# Patient Record
Sex: Male | Born: 1980 | Race: White | Hispanic: No | Marital: Single | Smoking: Never smoker
Health system: Southern US, Community
[De-identification: ages and names within clinical notes are randomized; demographics above are authoritative.]

---

## 1999-03-02 ENCOUNTER — Encounter: Payer: Self-pay | Admitting: General Surgery

## 1999-03-02 ENCOUNTER — Encounter: Payer: Self-pay | Admitting: Emergency Medicine

## 1999-03-02 ENCOUNTER — Inpatient Hospital Stay (HOSPITAL_COMMUNITY): Admission: EM | Admit: 1999-03-02 | Discharge: 1999-03-12 | Payer: Self-pay | Admitting: Emergency Medicine

## 1999-03-03 ENCOUNTER — Encounter: Payer: Self-pay | Admitting: General Surgery

## 1999-03-03 ENCOUNTER — Encounter: Payer: Self-pay | Admitting: Cardiothoracic Surgery

## 1999-03-04 ENCOUNTER — Encounter: Payer: Self-pay | Admitting: General Surgery

## 1999-03-05 ENCOUNTER — Encounter: Payer: Self-pay | Admitting: General Surgery

## 1999-03-06 ENCOUNTER — Encounter: Payer: Self-pay | Admitting: General Surgery

## 1999-03-07 ENCOUNTER — Encounter: Payer: Self-pay | Admitting: General Surgery

## 1999-03-08 ENCOUNTER — Encounter: Payer: Self-pay | Admitting: General Surgery

## 1999-03-09 ENCOUNTER — Encounter: Payer: Self-pay | Admitting: General Surgery

## 1999-03-10 ENCOUNTER — Encounter: Payer: Self-pay | Admitting: General Surgery

## 1999-03-11 ENCOUNTER — Encounter: Payer: Self-pay | Admitting: Orthopedic Surgery

## 1999-03-12 ENCOUNTER — Encounter: Payer: Self-pay | Admitting: General Surgery

## 1999-03-19 ENCOUNTER — Encounter: Payer: Self-pay | Admitting: General Surgery

## 1999-03-19 ENCOUNTER — Ambulatory Visit (HOSPITAL_COMMUNITY): Admission: RE | Admit: 1999-03-19 | Discharge: 1999-03-19 | Payer: Self-pay | Admitting: General Surgery

## 1999-04-08 ENCOUNTER — Ambulatory Visit (HOSPITAL_COMMUNITY): Admission: RE | Admit: 1999-04-08 | Discharge: 1999-04-08 | Payer: Self-pay | Admitting: General Surgery

## 1999-04-08 ENCOUNTER — Encounter: Payer: Self-pay | Admitting: General Surgery

## 1999-04-21 ENCOUNTER — Ambulatory Visit (HOSPITAL_COMMUNITY): Admission: RE | Admit: 1999-04-21 | Discharge: 1999-04-21 | Payer: Self-pay

## 2000-02-04 ENCOUNTER — Emergency Department (HOSPITAL_COMMUNITY): Admission: EM | Admit: 2000-02-04 | Discharge: 2000-02-04 | Payer: Self-pay | Admitting: Emergency Medicine

## 2000-02-04 ENCOUNTER — Encounter: Payer: Self-pay | Admitting: Emergency Medicine

## 2001-02-23 ENCOUNTER — Encounter: Payer: Self-pay | Admitting: Emergency Medicine

## 2001-02-23 ENCOUNTER — Emergency Department (HOSPITAL_COMMUNITY): Admission: AC | Admit: 2001-02-23 | Discharge: 2001-02-23 | Payer: Self-pay

## 2003-03-20 ENCOUNTER — Emergency Department (HOSPITAL_COMMUNITY): Admission: AC | Admit: 2003-03-20 | Discharge: 2003-03-21 | Payer: Self-pay

## 2003-04-18 ENCOUNTER — Encounter: Payer: Self-pay | Admitting: Internal Medicine

## 2003-04-18 ENCOUNTER — Encounter: Admission: RE | Admit: 2003-04-18 | Discharge: 2003-04-18 | Payer: Self-pay | Admitting: Internal Medicine

## 2005-07-29 ENCOUNTER — Encounter: Admission: RE | Admit: 2005-07-29 | Discharge: 2005-07-29 | Payer: Self-pay | Admitting: Internal Medicine

## 2013-08-01 ENCOUNTER — Ambulatory Visit: Payer: Self-pay

## 2013-08-01 ENCOUNTER — Other Ambulatory Visit: Payer: Self-pay | Admitting: Occupational Medicine

## 2013-08-01 DIAGNOSIS — M79671 Pain in right foot: Secondary | ICD-10-CM

## 2013-08-01 DIAGNOSIS — M25571 Pain in right ankle and joints of right foot: Secondary | ICD-10-CM

## 2017-12-11 DIAGNOSIS — S61211A Laceration without foreign body of left index finger without damage to nail, initial encounter: Secondary | ICD-10-CM | POA: Diagnosis not present

## 2017-12-16 DIAGNOSIS — Z0000 Encounter for general adult medical examination without abnormal findings: Secondary | ICD-10-CM | POA: Diagnosis not present

## 2017-12-23 DIAGNOSIS — Z0000 Encounter for general adult medical examination without abnormal findings: Secondary | ICD-10-CM | POA: Diagnosis not present

## 2017-12-23 DIAGNOSIS — H9319 Tinnitus, unspecified ear: Secondary | ICD-10-CM | POA: Diagnosis not present

## 2019-03-06 ENCOUNTER — Other Ambulatory Visit: Payer: Self-pay | Admitting: Physician Assistant

## 2019-03-06 DIAGNOSIS — S60221A Contusion of right hand, initial encounter: Secondary | ICD-10-CM

## 2019-07-14 ENCOUNTER — Other Ambulatory Visit: Payer: Self-pay

## 2019-07-14 DIAGNOSIS — Z20822 Contact with and (suspected) exposure to covid-19: Secondary | ICD-10-CM

## 2019-07-18 LAB — NOVEL CORONAVIRUS, NAA: SARS-CoV-2, NAA: NOT DETECTED

## 2019-08-30 ENCOUNTER — Ambulatory Visit (INDEPENDENT_AMBULATORY_CARE_PROVIDER_SITE_OTHER): Payer: Commercial Managed Care - PPO | Admitting: Medical

## 2019-08-30 VITALS — BP 122/74 | HR 91 | Temp 98.8°F | Resp 12 | Ht 72.0 in | Wt 313.6 lb

## 2019-08-30 DIAGNOSIS — R05 Cough: Secondary | ICD-10-CM

## 2019-08-30 DIAGNOSIS — U071 COVID-19: Secondary | ICD-10-CM | POA: Diagnosis not present

## 2019-08-30 DIAGNOSIS — E669 Obesity, unspecified: Secondary | ICD-10-CM | POA: Insufficient documentation

## 2019-08-30 DIAGNOSIS — R0602 Shortness of breath: Secondary | ICD-10-CM | POA: Diagnosis not present

## 2019-08-30 DIAGNOSIS — R0683 Snoring: Secondary | ICD-10-CM

## 2019-08-30 DIAGNOSIS — R059 Cough, unspecified: Secondary | ICD-10-CM

## 2019-08-30 DIAGNOSIS — R0681 Apnea, not elsewhere classified: Secondary | ICD-10-CM | POA: Insufficient documentation

## 2019-08-30 MED ORDER — ALBUTEROL SULFATE HFA 108 (90 BASE) MCG/ACT IN AERS: 2.0000 | INHALATION_SPRAY | Freq: Four times a day (QID) | RESPIRATORY_TRACT | 0 refills | Status: AC | PRN

## 2019-08-30 MED ORDER — BENZONATATE 200 MG PO CAPS: 200.0000 mg | ORAL_CAPSULE | Freq: Three times a day (TID) | ORAL | 0 refills | Status: DC | PRN

## 2019-08-30 MED ORDER — EMERGEN-C IMMUNE PLUS PO PACK: 1.0000 | PACK | Freq: Two times a day (BID) | ORAL | 0 refills | Status: AC

## 2019-08-30 NOTE — Progress Notes (Signed)
[facility]   Subjective:  Victor Greene is a 39 y.o. male who presents for respiratory illness.    PCP:  Pearson Grippe, MD  Victor Greene reports no significant prior health history.  He notes he normally goes in for a physical once a year with Dr. Selena Batten.  He was in his usual state of health until started feeling bad 08/24/2018.  He got tested for Covid the next day and was positive the following day.  His wife became ill a few days before him and she also was positive.  He notes that she is very sick and is being seen here in the respiratory clinic tonight as well.   She has brain cancer asthma and is not doing well with her symptoms.  He asked to be seen tonight when they checked him.  He was not referred by his PCP  Since his symptoms began he has continued to have body aches, sweats, burning in the throat, coughing, nausea, some loose stool the last 2 days, fever up to 102 at times, shortness of breath, wheezing.  They purchased a pulse oximeter.  His pulse oxygen has been as low as 92% he has been using Tylenol for symptoms.  He denies vomiting.  He feels that he is drinking plenty of fluids.  Unrelated, he notes that he does snore, his wife has mentioned that he probably has sleep apnea.  He agrees he probably has sleep apnea.  No prior testing.   Patient is not a smoker.  No other aggravating or relieving factors.  No other c/o.  History reviewed. No pertinent past medical history.  History reviewed. No pertinent past medical history.  ROS as in subjective   Objective: BP 122/74 (BP Location: Left Arm, Patient Position: Sitting, Cuff Size: Large)   Pulse 91   Temp 98.8 F (37.1 C)   Resp 12   Ht 6' (1.829 m)   Wt (!) 313 lb 9.6 oz (142.2 kg)   SpO2 97%   BMI 42.53 kg/m    Wt Readings from Last 3 Encounters:  08/30/19 (!) 313 lb 9.6 oz (142.2 kg)   Wt Readings from Last 3 Encounters:  08/30/19 (!) 313 lb 9.6 oz (142.2 kg)   BP Readings from Last 3  Encounters:  08/30/19 122/74     General appearance: Alert, WD/WN, no distress, mildly ill appearing, coughing periodically, obese white male                             Skin: warm, no rash                           Head: no sinus tenderness                            Eyes: conjunctiva injected, corneas clear, PERRLA                            Ears: Flat TMs, external ear canals normal                          Nose: septum midline, turbinates swollen, with erythema and no discharge             Mouth/throat: MMM, tongue normal, mild pharyngeal erythema  Neck: supple, no adenopathy, no thyromegaly, non tender                          Heart: RRR, normal S1, S2, no murmurs                         Lungs: Faint crackles throughout , no wheezes, but no labored breathing Abdomen: Nontender no organomegaly, no mass Extremities nontender no asymmetry negative Homans, no edema       Assessment  Encounter Diagnoses  Name Primary?  . Cough Yes  . COVID-19   . SOB (shortness of breath)   . Snoring   . Witnessed apneic spells   . Obesity, unspecified classification, unspecified obesity type, unspecified whether serious comorbidity present       Plan: We discussed symptoms, concerns.   I reviewed his chart history.    We discussed that his current vital signs are stable, his lungs do have some coarse sounds.  We will send him for x-ray tomorrow morning at [facility].  We discussed using albuterol to help with symptoms, Tessalon Perle cough drops, and discussed the other general recommendations below.  We discussed symptoms that would prompt urgent recheck at the hospital.  We discussed quarantine measures.  Advised if pulse ox is staying 90% or below to get reevaluated at the hospital.  Advised that once he is beyond quarantine and improving back to baseline he should have a follow-up with his primary care for routine physical and evaluation for sleep  apnea.   General recommendations: I recommend you rest, hydrate well with water and clear fluids throughout the day.   Drink enough water and clear fluids so that your urine is clear.   Begin Albuterol resource inhaler 2 puffs every 4- 6 hours for cough, wheezing, or shortness of breath You can use Tylenol for pain or fever every 4-6 hours. You can use over the counter Tessalon Perles prescription for cough. You can use over the counter Emetrol for nausea.    Consider using over the counter Emergen-C Immune + over the counter for the next week.   If you need any medications from the pharmacy, have a friend or family member pick them up from the pharmacy for you.  Have them drop off the medications at your home to keep you from having to go into the pharmacy and potentially exposure others.   If this is not possible, see if your pharmacy does home delivery.  Or worse case scenario, go through the drive through at your pharmacy, wear your mask, use hand sanitizer before touching anything in the drive through transaction, and limit interaction with the store personally, particularly staying > 6 feet apart.  Over the next few days if you are having worse trouble breathing, if you are very weak, have persistent fever 101 or higher consistently despite Tylenol, uncontrollable nausea and vomiting, or feel very dehydrated, then call or go to the emergency department.    If you have other questions or have other symptoms or questions you are concerned about then please make a virtual visit with your primary care provider.  Covid symptoms such as fatigue and cough can linger over 2 weeks, even after the initial fever, aches, chills, and other initial symptoms.     Self Quarantine: The CDC, Centers for Disease Control has recommended a self quarantine of 10 -14 days from the start of your illness until  you are symptom-free including at least 24 hours of no symptoms including no fever, no shortness of  breath, and no body aches and chills, by day 10 before returning to work or general contact with the public.  What does self quarantine mean: avoiding contact with people as much as possible.   Particularly in your house, isolate your self from others in a separate room, wear a mask when possible in the room, particularly if coughing a lot.   Have others bring food, water, medications, etc., to your door, but avoid direct contact with your household contacts during this time to avoid spreading the infection to them.   If you have a separate bathroom and living quarters during the next 2 weeks away from others, that would be preferable.    If you can't completely isolate, then wear a mask, wash hands frequently with soap and water for at least 15 seconds, minimize close contact with others, and have a friend or family member check regularly from a distance to make sure you are not getting seriously worse.     You should not be going out in public, should not be going to stores, to work or other public places until all your symptoms have resolved and at least 10 days + 24 hours of no symptoms at all have transpired.   Ideally you should avoid contact with others for a full 10 days if possible.  One of the goals is to limit spread to high risk people; people that are older and elderly, people with multiple health issues like diabetes, heart disease, lung disease, and anybody that has weakened immune systems such as people with cancer or on immunosuppressive therapy.   Please make a virtual follow up visit within 3-4  days with your primary care provider.      Laurance was seen today for covid positive.  Diagnoses and all orders for this visit:  Cough -     DG Chest 2 View; Future -     Pulse oximetry (single); Future  COVID-19 -     DG Chest 2 View; Future -     Pulse oximetry (single); Future  SOB (shortness of breath) -     DG Chest 2 View; Future -     Pulse oximetry (single);  Future  Snoring  Witnessed apneic spells  Obesity, unspecified classification, unspecified obesity type, unspecified whether serious comorbidity present  Other orders -     benzonatate (TESSALON) 200 MG capsule; Take 1 capsule (200 mg total) by mouth 3 (three) times daily as needed for cough. -     albuterol (VENTOLIN HFA) 108 (90 Base) MCG/ACT inhaler; Inhale 2 puffs into the lungs every 6 (six) hours as needed for wheezing or shortness of breath. -     Multiple Vitamins-Minerals (EMERGEN-C IMMUNE PLUS) PACK; Take 1 tablet by mouth 2 (two) times daily.

## 2019-08-30 NOTE — Addendum Note (Signed)
Addended by: Jac Canavan on: 08/30/2019 09:43 PM   Modules accepted: Orders

## 2019-08-30 NOTE — Patient Instructions (Signed)
Go for xray of chest at [facility] tomorrow after 8am.    Covid symptoms can include fever, tiredness, body aches, cough, sore throat, diarrhea, headache, loss of taste or smell, shortness of breath, rash, and discoloration of fingers or toes.    Risk factors that may put someone at risk for worse outcome with covid infection includes older than 40 years old, underlying health conditions like COPD, heart failure, asthma, diabetes, hypertension, kidney disease, obesity, and history of heart disease or stroke.  Currently your symptoms seem mild.     General recommendations: I recommend you rest, hydrate well with water and clear fluids throughout the day.   Drink enough water and clear fluids so that your urine is clear.   Begin Albuterol resource inhaler 2 puffs every 4- 6 hours for cough, wheezing, or shortness of breath You can use Tylenol for pain or fever every 4-6 hours. You can use over the counter Tessalon Perles prescription for cough. You can use over the counter Emetrol for nausea.    Consider using over the counter Emergen-C Immune + over the counter for the next week.  If you need any medications from the pharmacy, have a friend or family member pick them up from the pharmacy for you.  Have them drop off the medications at your home to keep you from having to go into the pharmacy and potentially exposure others.   If this is not possible, see if your pharmacy does home delivery.  Or worse case scenario, go through the drive through at your pharmacy, wear your mask, use hand sanitizer before touching anything in the drive through transaction, and limit interaction with the store personally, particularly staying > 6 feet apart.  Over the next few days if you are having worse trouble breathing, if you are very weak, have persistent fever 101 or higher consistently despite Tylenol, uncontrollable nausea and vomiting, or feel very dehydrated, then call or go to the emergency  department.    If you have other questions or have other symptoms or questions you are concerned about then please make a virtual visit with your primary care provider.  Covid symptoms such as fatigue and cough can linger over 2 weeks, even after the initial fever, aches, chills, and other initial symptoms.     Self Quarantine: The CDC, Centers for Disease Control has recommended a self quarantine of 10 -14 days from the start of your illness until you are symptom-free including at least 24 hours of no symptoms including no fever, no shortness of breath, and no body aches and chills, by day 10 before returning to work or general contact with the public.  What does self quarantine mean: avoiding contact with people as much as possible.   Particularly in your house, isolate your self from others in a separate room, wear a mask when possible in the room, particularly if coughing a lot.   Have others bring food, water, medications, etc., to your door, but avoid direct contact with your household contacts during this time to avoid spreading the infection to them.   If you have a separate bathroom and living quarters during the next 2 weeks away from others, that would be preferable.    If you can't completely isolate, then wear a mask, wash hands frequently with soap and water for at least 15 seconds, minimize close contact with others, and have a friend or family member check regularly from a distance to make sure you are not getting  seriously worse.     You should not be going out in public, should not be going to stores, to work or other public places until all your symptoms have resolved and at least 10 days + 24 hours of no symptoms at all have transpired.   Ideally you should avoid contact with others for a full 10 days if possible.  One of the goals is to limit spread to high risk people; people that are older and elderly, people with multiple health issues like diabetes, heart disease, lung disease,  and anybody that has weakened immune systems such as people with cancer or on immunosuppressive therapy.   Please make a virtual follow up visit within 3-4  days with your primary care provider.

## 2019-09-01 ENCOUNTER — Other Ambulatory Visit: Payer: Self-pay

## 2019-09-01 ENCOUNTER — Ambulatory Visit (HOSPITAL_COMMUNITY)
Admission: RE | Admit: 2019-09-01 | Discharge: 2019-09-01 | Disposition: A | Discharge status: Home / Self Care | Payer: Commercial Managed Care - PPO | Source: Ambulatory Visit | Attending: Medical | Admitting: Medical

## 2019-09-01 ENCOUNTER — Other Ambulatory Visit: Payer: Self-pay | Admitting: Medical

## 2019-09-01 DIAGNOSIS — R05 Cough: Secondary | ICD-10-CM | POA: Diagnosis present

## 2019-09-01 DIAGNOSIS — R0602 Shortness of breath: Secondary | ICD-10-CM | POA: Diagnosis present

## 2019-09-01 DIAGNOSIS — U071 COVID-19: Secondary | ICD-10-CM | POA: Insufficient documentation

## 2019-09-01 DIAGNOSIS — R059 Cough, unspecified: Secondary | ICD-10-CM

## 2019-09-01 MED ORDER — AZITHROMYCIN 250 MG PO TABS: ORAL_TABLET | ORAL | 0 refills | Status: DC

## 2019-09-04 ENCOUNTER — Other Ambulatory Visit: Payer: Self-pay

## 2019-09-04 ENCOUNTER — Ambulatory Visit (INDEPENDENT_AMBULATORY_CARE_PROVIDER_SITE_OTHER): Payer: Commercial Managed Care - PPO | Admitting: Medical

## 2019-09-04 ENCOUNTER — Other Ambulatory Visit: Payer: Self-pay | Admitting: Medical

## 2019-09-04 VITALS — BP 134/72 | HR 73 | Temp 98.9°F | Wt 311.0 lb

## 2019-09-04 DIAGNOSIS — R0602 Shortness of breath: Secondary | ICD-10-CM | POA: Diagnosis not present

## 2019-09-04 DIAGNOSIS — U071 COVID-19: Secondary | ICD-10-CM

## 2019-09-04 DIAGNOSIS — R05 Cough: Secondary | ICD-10-CM | POA: Diagnosis not present

## 2019-09-04 DIAGNOSIS — R059 Cough, unspecified: Secondary | ICD-10-CM

## 2019-09-04 MED ORDER — ALBUTEROL SULFATE (2.5 MG/3ML) 0.083% IN NEBU: 2.5000 mg | INHALATION_SOLUTION | Freq: Four times a day (QID) | RESPIRATORY_TRACT | 0 refills | Status: AC | PRN

## 2019-09-04 MED ORDER — HYDROCODONE-HOMATROPINE 5-1.5 MG/5ML PO SYRP: 5.0000 mL | ORAL_SOLUTION | Freq: Three times a day (TID) | ORAL | 0 refills | Status: AC | PRN

## 2019-09-04 MED ORDER — BUDESONIDE 0.25 MG/2ML IN SUSP: 0.2500 mg | Freq: Two times a day (BID) | RESPIRATORY_TRACT | 0 refills | Status: AC

## 2019-09-04 MED ORDER — METHYLPREDNISOLONE ACETATE 40 MG/ML IJ SUSP
40.0000 mg | Freq: Once | INTRAMUSCULAR | Status: AC
  Administered 2019-09-04: 40 mg via INTRAMUSCULAR

## 2019-09-04 NOTE — Progress Notes (Signed)
[facility]   Subjective:  Victor Greene is a 39 y.o. male who presents for respiratory illness.    PCP:  Pearson Grippe, MD  Here for recheck.   He was seen in this clinic 5 days ago for covid illness.  He notes that he is worse.    He currently reports cough, SOB, wheezing, fatigue, headaches, sore throat, nausea, abdominal discomfort.  Has been using tylenol, ibuprofen, tessalon perles, zpak, and albuterol.   Feels worse with SOB.  Wife notes his ox level at home can get down to 88%.    At his last visit he reported no significant prior health history.  He notes he normally goes in for a physical once a year with Dr. Selena Batten.  He was in his usual state of health until started feeling bad 08/24/2018.  He got tested for Covid the next day and was positive the following day.  His wife became ill a few days before him and she also was positive.  Since his symptoms began he has continued to have body aches, sweats, burning in the throat, coughing, nausea, some loose stool the last 2 days, fever up to 102 at times, shortness of breath, wheezing.  They purchased a pulse oximeter.  His pulse oxygen had been as low as 92%.  Unrelated, he notes that he does snore, his wife has mentioned that he probably has sleep apnea.  He agrees he probably has sleep apnea.  No prior testing.   Patient is not a smoker.  No other aggravating or relieving factors.  No other c/o.  No past medical history on file.  ROS as in subjective   Objective: BP 134/72   Pulse 73   Temp 98.9 F (37.2 C)   Wt (!) 311 lb (141.1 kg)   SpO2 94%   BMI 42.18 kg/m    Wt Readings from Last 3 Encounters:  09/04/19 (!) 311 lb (141.1 kg)  08/30/19 (!) 313 lb 9.6 oz (142.2 kg)    BP Readings from Last 3 Encounters:  09/04/19 134/72  08/30/19 122/74     General appearance: Alert, WD/WN, no distress, mildly ill appearing, coughing often, a bit more tachypneic this time compared to 5 days ago, obese white  male                             Skin: warm, no rash                           Head: no sinus tenderness                            Eyes: conjunctiva injected, corneas clear, PERRLA                            Ears: Flat TMs, external ear canals normal                          Nose: septum midline, turbinates swollen, with erythema and no discharge             Mouth/throat: MMM, tongue normal, mild pharyngeal erythema                           Neck: supple,  no adenopathy, no thyromegaly, non tender                          Heart: RRR, normal S1, S2, no murmurs                         Lungs: Faint crackles throughout , no wheezes, but no labored breathing Abdomen: Nontender no organomegaly, no mass Extremities nontender no asymmetry negative Homans, no edema       Assessment  Encounter Diagnoses  Name Primary?  . Cough Yes  . SOB (shortness of breath)   . COVID-19       Plan: We discussed symptoms, concerns.   I reviewed his chart history.    We discussed that his current vital signs are stable, his lungs do have some coarse sounds.  Depo Medrol 40mg  IM given tonight   Patient instructions:  We are checking labs tonight.   If white counts are up tomorrow, I may have you go for repeat chest xray at [facility] after 8am.  Continue to rest, hydrate throughout the day, at least 2 liter of clear fluids  You can use the Hycodan syrup for worse cough.  This can make you drowsy.   Begin nebulized albuterol twice daily in combination with Pulmicort steroid nebulized treatment.  For worse shortness of breath, you can use the Albuterol liquid for nebulized treatment every 4-6 hours.  You can use Zofran that your wife has every 4-6 hours for nausea.  Finish out the zpak.    We gave you a shot of steroid tonight to help reduce inflammation in the lungs  Sleep upright.  We will call tomorrow with labs.    We discussed symptoms that would prompt urgent recheck at the  hospital.  We discussed quarantine measures.  Advised if pulse ox is staying 90% or below to get reevaluated at the hospital.  Advised that once he is beyond quarantine and improving back to baseline he should have a follow-up with his primary care for routine physical and evaluation for sleep apnea.    If you need any medications from the pharmacy, have a friend or family member pick them up from the pharmacy for you.  Have them drop off the medications at your home to keep you from having to go into the pharmacy and potentially exposure others.   If this is not possible, see if your pharmacy does home delivery.  Or worse case scenario, go through the drive through at your pharmacy, wear your mask, use hand sanitizer before touching anything in the drive through transaction, and limit interaction with the store personally, particularly staying > 6 feet apart.  Over the next few days if you are having worse trouble breathing, if you are very weak, have persistent fever 101 or higher consistently despite Tylenol, uncontrollable nausea and vomiting, or feel very dehydrated, then call or go to the emergency department.    If you have other questions or have other symptoms or questions you are concerned about then please make a virtual visit with your primary care provider.  Covid symptoms such as fatigue and cough can linger over 2 weeks, even after the initial fever, aches, chills, and other initial symptoms.     Self Quarantine: The CDC, Centers for Disease Control has recommended a self quarantine of 10 -14 days from the start of your illness until you are symptom-free including at least  24 hours of no symptoms including no fever, no shortness of breath, and no body aches and chills, by day 10 before returning to work or general contact with the public.  What does self quarantine mean: avoiding contact with people as much as possible.   Particularly in your house, isolate your self from others in  a separate room, wear a mask when possible in the room, particularly if coughing a lot.   Have others bring food, water, medications, etc., to your door, but avoid direct contact with your household contacts during this time to avoid spreading the infection to them.   If you have a separate bathroom and living quarters during the next 2 weeks away from others, that would be preferable.    If you can't completely isolate, then wear a mask, wash hands frequently with soap and water for at least 15 seconds, minimize close contact with others, and have a friend or family member check regularly from a distance to make sure you are not getting seriously worse.     You should not be going out in public, should not be going to stores, to work or other public places until all your symptoms have resolved and at least 10 days + 24 hours of no symptoms at all have transpired.   Ideally you should avoid contact with others for a full 10 days if possible.  One of the goals is to limit spread to high risk people; people that are older and elderly, people with multiple health issues like diabetes, heart disease, lung disease, and anybody that has weakened immune systems such as people with cancer or on immunosuppressive therapy.   Please make a virtual follow up visit within 3-4  days with your primary care provider.      Dave was seen today for follow-up.  Diagnoses and all orders for this visit:  Cough -     Cancel: Basic metabolic panel -     Cancel: CBC with Differential/Platelet -     DG Chest 2 View; Future -     Basic metabolic panel; Future -     CBC with Differential/Platelet; Future -     methylPREDNISolone acetate (DEPO-MEDROL) injection 40 mg -     CBC with Differential/Platelet -     Basic metabolic panel  SOB (shortness of breath) -     Cancel: Basic metabolic panel -     Cancel: CBC with Differential/Platelet -     DG Chest 2 View; Future -     Basic metabolic panel; Future -     CBC  with Differential/Platelet; Future -     methylPREDNISolone acetate (DEPO-MEDROL) injection 40 mg -     CBC with Differential/Platelet -     Basic metabolic panel  COVID-19 -     Cancel: Basic metabolic panel -     Cancel: CBC with Differential/Platelet -     DG Chest 2 View; Future -     Basic metabolic panel; Future -     CBC with Differential/Platelet; Future -     CBC with Differential/Platelet -     Basic metabolic panel  Other orders -     albuterol (PROVENTIL) (2.5 MG/3ML) 0.083% nebulizer solution; Take 3 mLs (2.5 mg total) by nebulization every 6 (six) hours as needed for wheezing or shortness of breath. -     HYDROcodone-homatropine (HYCODAN) 5-1.5 MG/5ML syrup; Take 5 mLs by mouth every 8 (eight) hours as needed for up to 5 days  for cough. -     budesonide (PULMICORT) 0.25 MG/2ML nebulizer solution; Take 2 mLs (0.25 mg total) by nebulization 2 (two) times daily.

## 2019-09-04 NOTE — Patient Instructions (Signed)
We are checking labs tonight.   If white counts are up tomorrow, I may have you go for repeat chest xray at [facility] after 8am.  Continue to rest, hydrate throughout the day, at least 2 liter of clear fluids  You can use the Hycodan syrup for worse cough.  This can make you drowsy.   Begin nebulized albuterol twice daily in combination with Pulmicort steroid nebulized treatment.  For worse shortness of breath, you can use the Albuterol liquid for nebulized treatment every 4-6 hours.  You can use Zofran that your wife has every 4-6 hours for nausea.  Finish out the zpak.    We gave you a shot of steroid tonight to help reduce inflammation in the lungs  Sleep upright.  We will call tomorrow with labs.

## 2019-09-05 LAB — BASIC METABOLIC PANEL
BUN/Creatinine Ratio: 17 (ref 9–20)
BUN/Creatinine Ratio: 19 (ref 9–20)
BUN: 15 mg/dL (ref 6–20)
BUN: 10 mg/dL (ref 6–20)
CO2: 23 mmol/L (ref 20–29)
CO2: 23 mmol/L (ref 20–29)
Calcium: 8.6 mg/dL — ABNORMAL LOW (ref 8.7–10.2)
Calcium: 9.6 mg/dL (ref 8.7–10.2)
Chloride: 105 mmol/L (ref 96–106)
Chloride: 105 mmol/L (ref 96–106)
Creatinine, Ser: 0.89 mg/dL (ref 0.76–1.27)
Creatinine, Ser: 0.52 mg/dL — ABNORMAL LOW (ref 0.76–1.27)
GFR calc Af Amer: 125 mL/min/{1.73_m2} (ref >59)
GFR calc Af Amer: 155 mL/min/{1.73_m2} (ref >59)
GFR calc non Af Amer: 108 mL/min/{1.73_m2} (ref >59)
GFR calc non Af Amer: 134 mL/min/{1.73_m2} (ref >59)
Glucose: 107 mg/dL — ABNORMAL HIGH (ref 65–99)
Glucose: 99 mg/dL (ref 65–99)
Potassium: 4.3 mmol/L (ref 3.5–5.2)
Potassium: 4.6 mmol/L (ref 3.5–5.2)
Sodium: 142 mmol/L (ref 134–144)
Sodium: 142 mmol/L (ref 134–144)

## 2019-09-05 LAB — CBC WITH DIFFERENTIAL/PLATELET
Basophils Absolute: 0 10*3/uL (ref 0.0–0.2)
Basophils Absolute: 0 10*3/uL (ref 0.0–0.2)
Basos: 0 %
Basos: 1 %
EOS (ABSOLUTE): 0 10*3/uL (ref 0.0–0.4)
EOS (ABSOLUTE): 0.2 10*3/uL (ref 0.0–0.4)
Eos: 0 %
Eos: 2 %
Hematocrit: 41.6 % (ref 37.5–51.0)
Hematocrit: 40.3 % (ref 37.5–51.0)
Hemoglobin: 14.3 g/dL (ref 13.0–17.7)
Hemoglobin: 13.7 g/dL (ref 13.0–17.7)
Immature Grans (Abs): 0.5 10*3/uL — ABNORMAL HIGH (ref 0.0–0.1)
Immature Grans (Abs): 0 10*3/uL (ref 0.0–0.1)
Immature Granulocytes: 5 %
Immature Granulocytes: 0 %
Lymphocytes Absolute: 1 10*3/uL (ref 0.7–3.1)
Lymphocytes Absolute: 1.4 10*3/uL (ref 0.7–3.1)
Lymphs: 9 %
Lymphs: 22 %
MCH: 32.4 pg (ref 26.6–33.0)
MCH: 30.9 pg (ref 26.6–33.0)
MCHC: 34.4 g/dL (ref 31.5–35.7)
MCHC: 34 g/dL (ref 31.5–35.7)
MCV: 94 fL (ref 79–97)
MCV: 91 fL (ref 79–97)
Monocytes Absolute: 1.1 10*3/uL — ABNORMAL HIGH (ref 0.1–0.9)
Monocytes Absolute: 0.5 10*3/uL (ref 0.1–0.9)
Monocytes: 10 %
Monocytes: 7 %
Neutrophils Absolute: 8.1 10*3/uL — ABNORMAL HIGH (ref 1.4–7.0)
Neutrophils Absolute: 4.5 10*3/uL (ref 1.4–7.0)
Neutrophils: 76 %
Neutrophils: 68 %
Platelets: 234 10*3/uL (ref 150–450)
Platelets: 335 10*3/uL (ref 150–450)
RBC: 4.42 x10E6/uL (ref 4.14–5.80)
RBC: 4.43 x10E6/uL (ref 4.14–5.80)
RDW: 12.2 % (ref 11.6–15.4)
RDW: 12.5 % (ref 11.6–15.4)
WBC: 10.6 10*3/uL (ref 3.4–10.8)
WBC: 6.6 10*3/uL (ref 3.4–10.8)

## 2019-09-06 ENCOUNTER — Other Ambulatory Visit: Payer: Self-pay

## 2019-09-06 ENCOUNTER — Ambulatory Visit (INDEPENDENT_AMBULATORY_CARE_PROVIDER_SITE_OTHER): Payer: Commercial Managed Care - PPO | Admitting: Medical

## 2019-09-06 ENCOUNTER — Ambulatory Visit (HOSPITAL_COMMUNITY)
Admission: RE | Admit: 2019-09-06 | Discharge: 2019-09-06 | Disposition: A | Discharge status: Home / Self Care | Payer: Commercial Managed Care - PPO | Source: Ambulatory Visit | Attending: Medical | Admitting: Medical

## 2019-09-06 VITALS — BP 130/90 | HR 78 | Temp 99.2°F | Ht 72.0 in | Wt 310.0 lb

## 2019-09-06 DIAGNOSIS — U071 COVID-19: Secondary | ICD-10-CM

## 2019-09-06 DIAGNOSIS — E669 Obesity, unspecified: Secondary | ICD-10-CM

## 2019-09-06 DIAGNOSIS — R0681 Apnea, not elsewhere classified: Secondary | ICD-10-CM

## 2019-09-06 DIAGNOSIS — R05 Cough: Secondary | ICD-10-CM | POA: Diagnosis not present

## 2019-09-06 DIAGNOSIS — R0602 Shortness of breath: Secondary | ICD-10-CM

## 2019-09-06 DIAGNOSIS — R059 Cough, unspecified: Secondary | ICD-10-CM

## 2019-09-06 DIAGNOSIS — R0683 Snoring: Secondary | ICD-10-CM

## 2019-09-06 DIAGNOSIS — R0902 Hypoxemia: Secondary | ICD-10-CM

## 2019-09-06 MED ORDER — PREDNISONE 10 MG PO TABS: ORAL_TABLET | ORAL | 0 refills | Status: AC

## 2019-09-06 MED ORDER — AMOXICILLIN 875 MG PO TABS: 875.0000 mg | ORAL_TABLET | Freq: Two times a day (BID) | ORAL | 0 refills | Status: AC

## 2019-09-06 NOTE — Patient Instructions (Addendum)
Patient instructions:   Your recent labs 2 days did not show anything worrisome.   Your xray result today is a bit worse for "viral" covid pneumonia.  There is no obvious sign of secondary bacterial pneumonia  I will send a request to your primary care provider Dr. Selena Batten to request order through [facility] for overnight oximetry test to see if you qualify for oxygen short term.  Expect a call from their office.   I know this feels scary, but you are relatively stable.   The covid virus is very taxing on your body, so know that it will take a few weeks to feel back to normal.    Recommendations: Continue to rest, hydrate throughout the day, at least 2 liter of clear fluids  You can use the Hycodan syrup for worse cough.  This can make you drowsy.   Continue nebulized albuterol twice daily in combination with Pulmicort steroid nebulized treatment.  For worse shortness of breath, you can use the Albuterol liquid for nebulized treatment every 4-6 hours.  You can use Zofran that your wife has every 4-6 hours for nausea.  You were requesting another antibiotic.  Your infection appears to be viral not bacteria.  Nevertheless, I sent Amoxicillin you can begin in the event of secondary bacterial infection.    Begin Prednisone steroid by mouth to use as follows:  Take 6 tablets day 1  Take 5 tablets day 2  Take 4 tablets day 3  Take 3 tablets day 4  Take 2 tablets day 5  Take 1 tablet day 6   Sleep upright or inclined for now, not flat.    Follow up with your primary care provider.   If your oxygen levels are running consistently lower than 92% then go to the emergency department as you have maximized outpatient treatment thus far.

## 2019-09-06 NOTE — Progress Notes (Signed)
[facility]   Subjective:  Victor Greene is a 39 y.o. male who presents for respiratory illness.    PCP:  Pearson Grippe, MD  Here for recheck.  I saw him 2 days ago here in this clinic.  We did labs and chest xray.  He feels not much better than 2 days ago but did feel some improvement on nebulized therapy at home and after there steroid shot last visit.    Today has headache, cough, sob, cloudy headed feeling.   Chest heaviness this morning.  Stool today neon green, 3 BM today but solid.  No vomiting.  Has had some nausea.    He was in his usual state of health until started feeling bad 08/24/2018.  He got tested for Covid the next day and was positive the following day.  His wife became ill a few days before him and she also was positive.  Since his symptoms began he has continued to have body aches, sweats, burning in the throat, coughing, nausea, shortness of breath, wheezing.  They purchased a pulse oximeter.  His pulse oxygen had been as low as 88%.  Patient is not a smoker.  No other aggravating or relieving factors.  No other c/o.  No past medical history on file.  ROS as in subjective   Objective: BP 130/90   Pulse 78   Temp 99.2 F (37.3 C)   Ht 6' (1.829 m)   Wt (!) 310 lb (140.6 kg)   SpO2 95%   BMI 42.04 kg/m    Wt Readings from Last 3 Encounters:  09/06/19 (!) 310 lb (140.6 kg)  09/04/19 (!) 311 lb (141.1 kg)  08/30/19 (!) 313 lb 9.6 oz (142.2 kg)    BP Readings from Last 3 Encounters:  09/06/19 130/90  09/04/19 134/72  08/30/19 122/74     General appearance: Alert, WD/WN, no distress, mildly ill appearing, coughing often, obese white male                             Skin: warm, no rash                           Head: no sinus tenderness                            Eyes: conjunctiva injected, corneas clear, PERRLA                            Ears: Flat TMs, external ear canals normal                          Nose: septum midline,  turbinates unremarkable             Mouth/throat: MMM, tongue normal, no pharyngeal erythema                           Neck: supple, no adenopathy, no thyromegaly, non tender                          Heart: RRR, normal S1, S2, no murmurs  Lungs: Faint crackles throughout lower fields only , no wheezes, but no labored breathing Abdomen: Nontender no organomegaly, no mass Extremities nontender no asymmetry negative Homans, no edema       Assessment  Encounter Diagnoses  Name Primary?  . COVID-19 Yes  . SOB (shortness of breath)   . Cough   . Snoring   . Witnessed apneic spells   . Obesity, unspecified classification, unspecified obesity type, unspecified whether serious comorbidity present   . Hypoxia       Plan: We discussed symptoms, concerns, pulse ox 95%   Reviewed his labs and chest xray with him.  Xray a little worse than prior, labs relatively ok.     We will send request to his PCP to order overnight oximetry through [employer] to determine need for home oxygen.   Given wife's significant health issues, he is trying to avoid hospitalization or ED visit.   Discussed the following:  Patient Instructions  Patient instructions:   Your recent labs 2 days did not show anything worrisome.   Your xray result today is a bit worse for "viral" covid pneumonia.  There is no obvious sign of secondary bacterial pneumonia  I will send a request to your primary care provider Dr. Maudie Mercury to request order through [facility] for overnight oximetry test to see if you qualify for oxygen short term.  Expect a call from their office.   I know this feels scary, but you are relatively stable.   The covid virus is very taxing on your body, so know that it will take a few weeks to feel back to normal.    Recommendations: Continue to rest, hydrate throughout the day, at least 2 liter of clear fluids  You can use the Hycodan syrup for worse cough.  This can make you drowsy.    Continue nebulized albuterol twice daily in combination with Pulmicort steroid nebulized treatment.  For worse shortness of breath, you can use the Albuterol liquid for nebulized treatment every 4-6 hours.  You can use Zofran that your wife has every 4-6 hours for nausea.  You were requesting another antibiotic.  Your infection appears to be viral not bacteria.  Nevertheless, I sent Amoxicillin you can begin in the event of secondary bacterial infection.    Begin Prednisone steroid by mouth to use as follows:  Take 6 tablets day 1  Take 5 tablets day 2  Take 4 tablets day 3  Take 3 tablets day 4  Take 2 tablets day 5  Take 1 tablet day 6   Sleep upright or inclined for now, not flat.    Follow up with your primary care provider.   If your oxygen levels are running consistently lower than 92% then go to the emergency department as you have maximized outpatient treatment thus far.       Axxel was seen today for possible pnuemonia.  Diagnoses and all orders for this visit:  COVID-19  SOB (shortness of breath)  Cough  Snoring  Witnessed apneic spells  Obesity, unspecified classification, unspecified obesity type, unspecified whether serious comorbidity present  Hypoxia  Other orders -     predniSONE (DELTASONE) 10 MG tablet; 6/5/4/3/2/1 taper -     amoxicillin (AMOXIL) 875 MG tablet; Take 1 tablet (875 mg total) by mouth 2 (two) times daily.

## 2019-09-13 ENCOUNTER — Telehealth: Payer: Self-pay | Admitting: Internal Medicine

## 2019-09-13 DIAGNOSIS — U071 COVID-19: Secondary | ICD-10-CM

## 2019-09-14 ENCOUNTER — Other Ambulatory Visit: Payer: Self-pay

## 2019-09-14 ENCOUNTER — Ambulatory Visit (HOSPITAL_COMMUNITY)
Admission: RE | Admit: 2019-09-14 | Discharge: 2019-09-14 | Disposition: A | Discharge status: Home / Self Care | Payer: Commercial Managed Care - PPO | Source: Ambulatory Visit | Attending: Internal Medicine | Admitting: Internal Medicine

## 2019-09-14 DIAGNOSIS — U071 COVID-19: Secondary | ICD-10-CM | POA: Insufficient documentation

## 2019-09-27 ENCOUNTER — Other Ambulatory Visit: Payer: Self-pay | Admitting: Medical

## 2020-05-20 ENCOUNTER — Other Ambulatory Visit (HOSPITAL_COMMUNITY): Payer: Self-pay

## 2020-05-20 MED FILL — DILTIAZEM ER 240 MG TABLET: 240 | 30 days supply | Qty: 30 | Fill #0

## 2020-05-20 MED FILL — metFORMIN HCL ER 500 MG TB2: 500 | 30 days supply | Qty: 60 | Fill #0

## 2020-06-21 MED FILL — DILTIAZEM ER 240 MG TABLET: 240 | 30 days supply | Qty: 30 | Fill #1

## 2020-06-21 MED FILL — metFORMIN HCL ER 500 MG TB2: 500 | 30 days supply | Qty: 60 | Fill #1

## 2020-07-23 MED FILL — DILTIAZEM ER 240 MG TABLET: 240 | 30 days supply | Qty: 30 | Fill #2

## 2020-07-23 MED FILL — metFORMIN HCL ER 500 MG TB2: 500 | 30 days supply | Qty: 60 | Fill #2

## 2020-08-22 MED FILL — metFORMIN HCL ER 500 MG TB2: 500 | 30 days supply | Qty: 60 | Fill #3

## 2020-08-22 MED FILL — DILTIAZEM ER 240 MG TABLET: 240 | 30 days supply | Qty: 30 | Fill #3

## 2020-09-20 ENCOUNTER — Other Ambulatory Visit (HOSPITAL_COMMUNITY): Payer: Self-pay | Admitting: Internal Medicine

## 2020-09-21 MED FILL — SILDENAFIL CITRATE 25 MG TA: 25 | 30 days supply | Qty: 8 | Fill #0

## 2020-10-02 MED FILL — metFORMIN HCL ER 500 MG TB2: 500 | 30 days supply | Qty: 60 | Fill #4

## 2020-10-02 MED FILL — DILTIAZEM ER 240 MG TABLET: 240 | 30 days supply | Qty: 30 | Fill #4

## 2020-11-06 MED FILL — DILTIAZEM ER 240 MG TABLET: 240 | 30 days supply | Qty: 30 | Fill #5

## 2020-11-06 MED FILL — metFORMIN HCL ER 500 MG TB2: 500 | 30 days supply | Qty: 60 | Fill #5

## 2020-12-12 ENCOUNTER — Other Ambulatory Visit (HOSPITAL_COMMUNITY): Payer: Self-pay | Admitting: Internal Medicine

## 2020-12-12 ENCOUNTER — Other Ambulatory Visit (HOSPITAL_COMMUNITY): Payer: Self-pay

## 2020-12-12 MED ORDER — DILTIAZEM HCL ER 240 MG PO CP24
ORAL_CAPSULE | ORAL | 3 refills | Status: AC
  Filled 2020-12-12: qty 30, 30d supply, fill #0

## 2020-12-12 MED ORDER — DILTIAZEM HCL ER COATED BEADS 240 MG PO TB24
ORAL_TABLET | ORAL | 3 refills | Status: AC
  Filled 2020-12-12: qty 30, 30d supply, fill #0
  Filled 2021-01-15: qty 30, 30d supply, fill #1
  Filled 2021-02-07: qty 30, 30d supply, fill #2

## 2020-12-12 MED ORDER — METFORMIN HCL 500 MG PO TABS
ORAL_TABLET | ORAL | 3 refills | Status: AC
  Filled 2020-12-12: qty 60, 30d supply, fill #0
  Filled 2021-01-15: qty 60, 30d supply, fill #1
  Filled 2021-02-07: qty 60, 30d supply, fill #2
  Filled 2021-04-04: qty 60, 30d supply, fill #3
  Filled 2021-05-09: qty 60, 30d supply, fill #4
  Filled 2021-06-20: qty 60, 30d supply, fill #5
  Filled 2021-08-26: qty 60, 30d supply, fill #6

## 2020-12-13 ENCOUNTER — Other Ambulatory Visit (HOSPITAL_COMMUNITY): Payer: Self-pay

## 2020-12-16 ENCOUNTER — Other Ambulatory Visit (HOSPITAL_COMMUNITY): Payer: Self-pay

## 2020-12-20 ENCOUNTER — Other Ambulatory Visit (HOSPITAL_COMMUNITY): Payer: Self-pay

## 2021-01-16 ENCOUNTER — Other Ambulatory Visit (HOSPITAL_COMMUNITY): Payer: Self-pay

## 2021-01-17 ENCOUNTER — Other Ambulatory Visit (HOSPITAL_COMMUNITY): Payer: Self-pay

## 2021-01-17 MED ORDER — ESCITALOPRAM OXALATE 10 MG PO TABS
10.0000 mg | ORAL_TABLET | Freq: Every day | ORAL | 3 refills | Status: DC
  Filled 2021-01-17: qty 30, 30d supply, fill #0

## 2021-01-17 MED ORDER — ROSUVASTATIN CALCIUM 5 MG PO TABS
5.0000 mg | ORAL_TABLET | Freq: Every day | ORAL | 3 refills | Status: DC
  Filled 2021-01-17: qty 30, 30d supply, fill #0
  Filled 2021-02-11: qty 30, 30d supply, fill #1
  Filled 2021-03-12: qty 30, 30d supply, fill #2
  Filled 2021-04-15: qty 30, 30d supply, fill #3
  Filled 2021-05-09: qty 30, 30d supply, fill #4
  Filled 2021-06-16 (×2): qty 30, 30d supply, fill #5
  Filled 2021-07-18: qty 30, 30d supply, fill #6
  Filled 2021-08-26: qty 30, 30d supply, fill #7
  Filled 2021-09-30: qty 30, 30d supply, fill #8
  Filled 2021-10-30: qty 30, 30d supply, fill #9
  Filled 2021-12-01: qty 30, 30d supply, fill #10
  Filled 2022-01-06: qty 30, 30d supply, fill #11

## 2021-01-21 ENCOUNTER — Other Ambulatory Visit (HOSPITAL_COMMUNITY): Payer: Self-pay

## 2021-01-21 MED ORDER — ALPRAZOLAM 0.25 MG PO TABS
ORAL_TABLET | ORAL | 0 refills | Status: AC
  Filled 2021-01-21: qty 15, 15d supply, fill #0

## 2021-02-07 ENCOUNTER — Other Ambulatory Visit (HOSPITAL_COMMUNITY): Payer: Self-pay

## 2021-02-11 ENCOUNTER — Other Ambulatory Visit (HOSPITAL_COMMUNITY): Payer: Self-pay

## 2021-03-12 ENCOUNTER — Other Ambulatory Visit (HOSPITAL_COMMUNITY): Payer: Self-pay

## 2021-03-25 ENCOUNTER — Other Ambulatory Visit (HOSPITAL_COMMUNITY): Payer: Self-pay

## 2021-03-25 MED ORDER — DILTIAZEM HCL ER COATED BEADS 180 MG PO CP24
ORAL_CAPSULE | ORAL | 3 refills | Status: DC
  Filled 2021-03-25: qty 30, 30d supply, fill #0
  Filled 2021-04-26: qty 30, 30d supply, fill #1
  Filled 2021-05-30: qty 30, 30d supply, fill #2
  Filled 2021-07-07: qty 30, 30d supply, fill #3
  Filled 2021-08-19: qty 30, 30d supply, fill #4
  Filled 2021-09-22: qty 30, 30d supply, fill #5
  Filled 2021-10-22: qty 30, 30d supply, fill #6
  Filled 2021-11-21: qty 30, 30d supply, fill #7
  Filled 2021-12-25: qty 30, 30d supply, fill #8
  Filled 2022-01-27: qty 30, 30d supply, fill #9
  Filled 2022-03-02: qty 30, 30d supply, fill #10

## 2021-03-25 MED ORDER — FLECAINIDE ACETATE 50 MG PO TABS
ORAL_TABLET | ORAL | 3 refills | Status: DC
  Filled 2021-03-25: qty 30, 30d supply, fill #0
  Filled 2021-04-26: qty 30, 30d supply, fill #1
  Filled 2021-06-06: qty 30, 30d supply, fill #2
  Filled 2021-07-18: qty 30, 30d supply, fill #3
  Filled 2021-08-26: qty 30, 30d supply, fill #4
  Filled 2021-09-30: qty 30, 30d supply, fill #5
  Filled 2021-10-30: qty 30, 30d supply, fill #6
  Filled 2021-12-01: qty 30, 30d supply, fill #7
  Filled 2022-01-06: qty 30, 30d supply, fill #8
  Filled 2022-02-06: qty 30, 30d supply, fill #9
  Filled 2022-03-22: qty 30, 30d supply, fill #10

## 2021-03-28 ENCOUNTER — Other Ambulatory Visit: Payer: Self-pay

## 2021-03-28 ENCOUNTER — Other Ambulatory Visit: Payer: Self-pay | Admitting: Occupational Medicine

## 2021-03-28 ENCOUNTER — Ambulatory Visit: Payer: Self-pay

## 2021-03-28 DIAGNOSIS — M79672 Pain in left foot: Secondary | ICD-10-CM

## 2021-04-04 ENCOUNTER — Other Ambulatory Visit (HOSPITAL_COMMUNITY): Payer: Self-pay

## 2021-04-15 ENCOUNTER — Other Ambulatory Visit (HOSPITAL_COMMUNITY): Payer: Self-pay

## 2021-04-26 ENCOUNTER — Other Ambulatory Visit (HOSPITAL_COMMUNITY): Payer: Self-pay

## 2021-05-09 ENCOUNTER — Other Ambulatory Visit (HOSPITAL_COMMUNITY): Payer: Self-pay

## 2021-05-30 ENCOUNTER — Other Ambulatory Visit (HOSPITAL_COMMUNITY): Payer: Self-pay

## 2021-06-02 ENCOUNTER — Other Ambulatory Visit (HOSPITAL_COMMUNITY): Payer: Self-pay

## 2021-06-06 ENCOUNTER — Other Ambulatory Visit (HOSPITAL_COMMUNITY): Payer: Self-pay

## 2021-06-16 ENCOUNTER — Other Ambulatory Visit (HOSPITAL_COMMUNITY): Payer: Self-pay

## 2021-06-17 ENCOUNTER — Other Ambulatory Visit (HOSPITAL_COMMUNITY): Payer: Self-pay

## 2021-06-20 ENCOUNTER — Other Ambulatory Visit (HOSPITAL_COMMUNITY): Payer: Self-pay

## 2021-07-07 ENCOUNTER — Other Ambulatory Visit (HOSPITAL_COMMUNITY): Payer: Self-pay

## 2021-07-18 ENCOUNTER — Other Ambulatory Visit (HOSPITAL_COMMUNITY): Payer: Self-pay

## 2021-07-30 ENCOUNTER — Other Ambulatory Visit (HOSPITAL_COMMUNITY): Payer: Self-pay

## 2021-08-19 ENCOUNTER — Other Ambulatory Visit (HOSPITAL_COMMUNITY): Payer: Self-pay

## 2021-08-26 ENCOUNTER — Other Ambulatory Visit (HOSPITAL_COMMUNITY): Payer: Self-pay

## 2021-09-15 IMAGING — DX DG FOOT COMPLETE 3+V*L*
3 series · 3 of 3 positions shown · non-contrast
Comparison: None.

CLINICAL DATA: Left foot pain.

EXAM:
LEFT FOOT - COMPLETE 3+ VIEW

[foot ap]
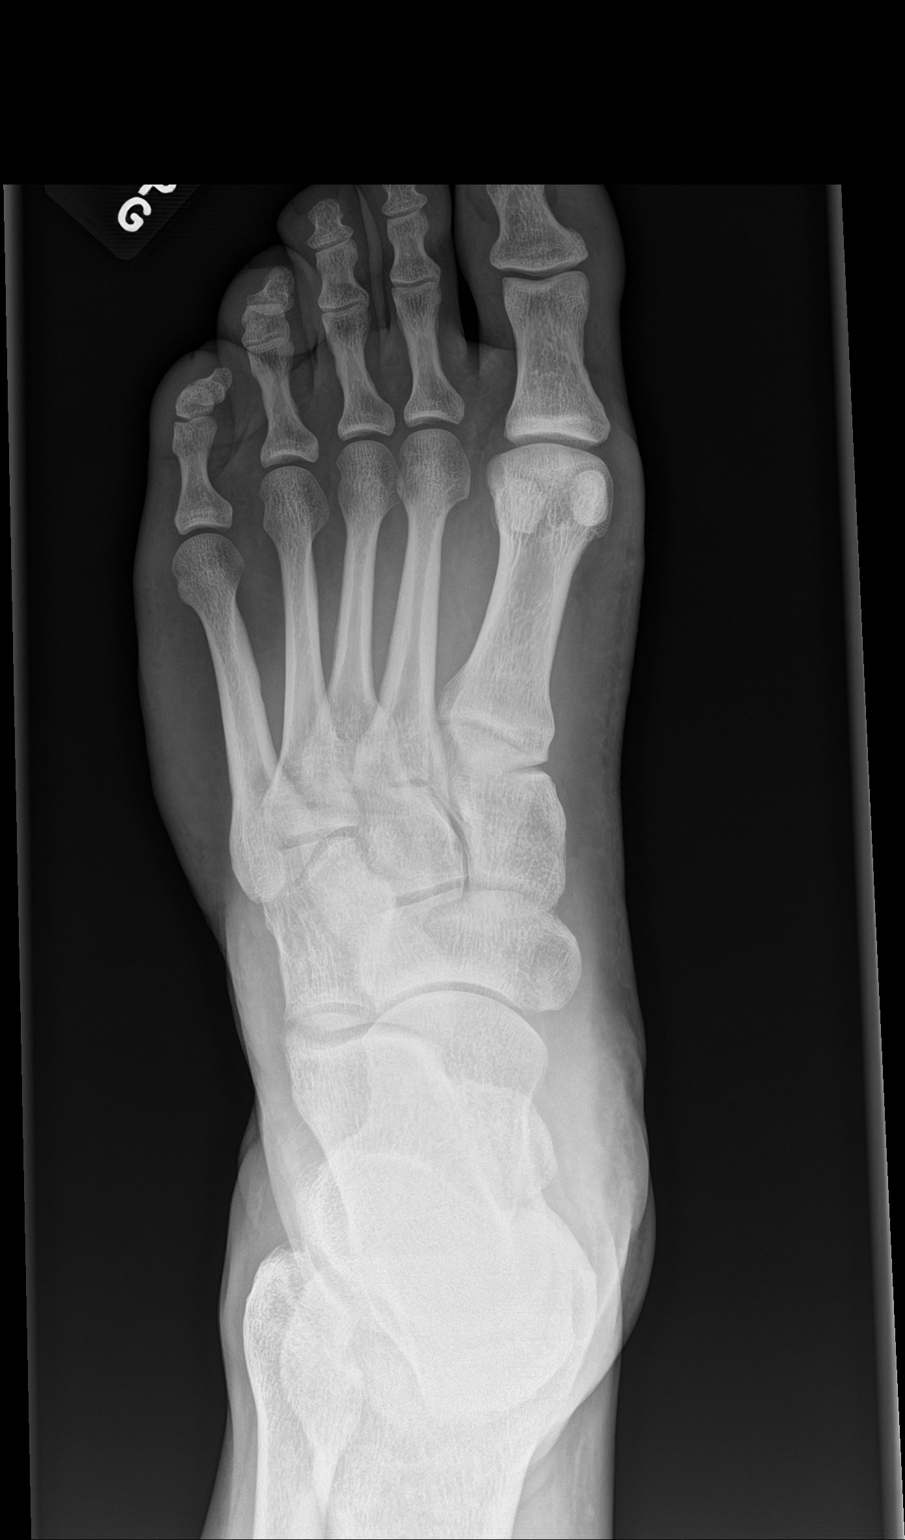

[foot obl]
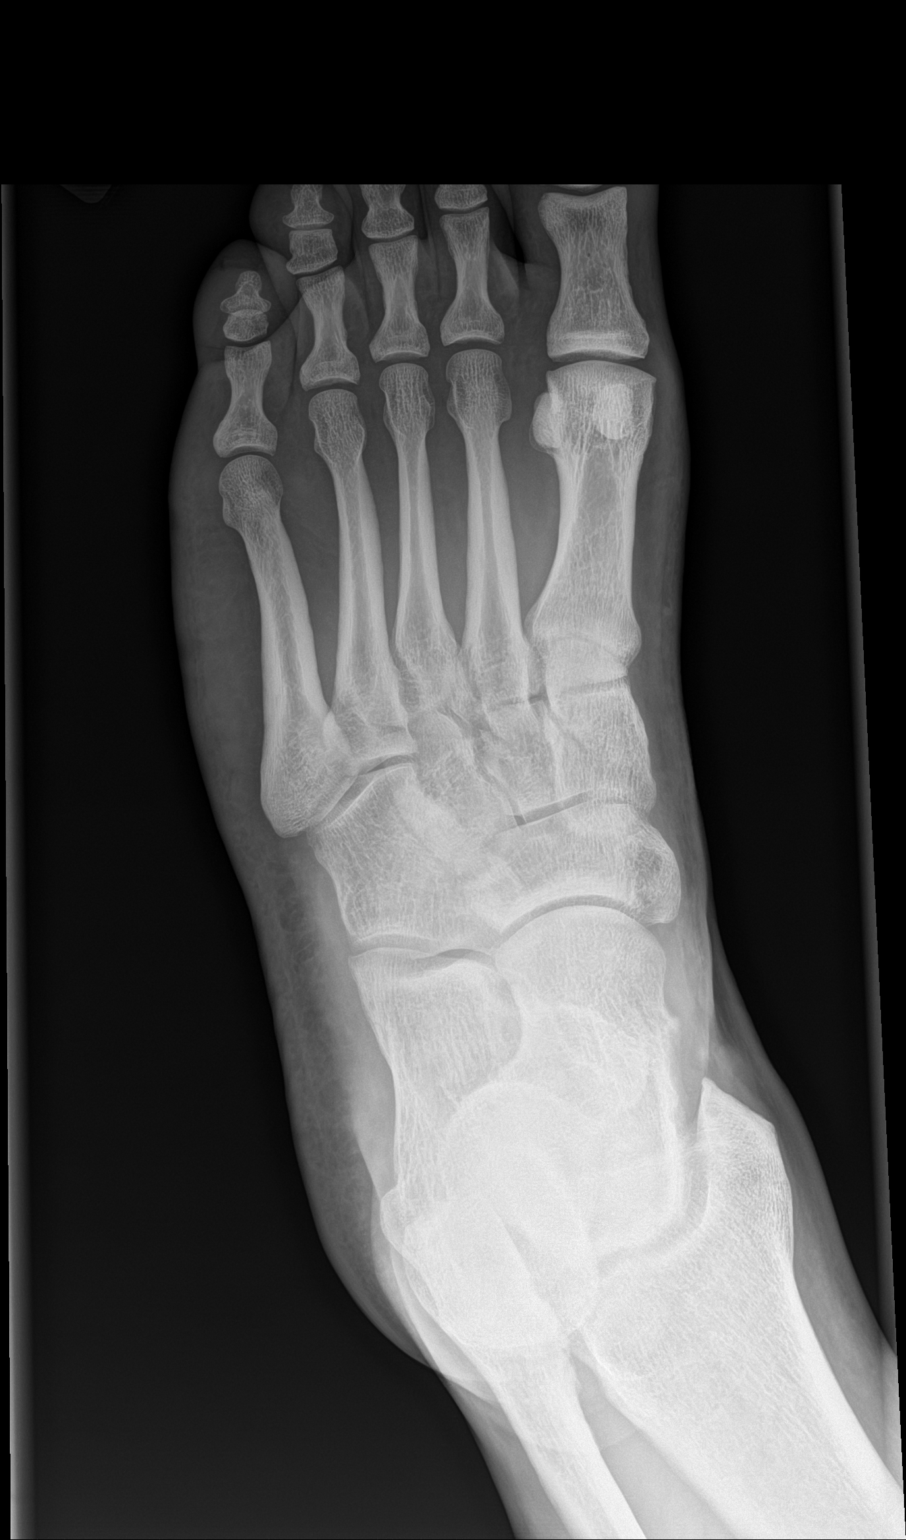

[foot lat]
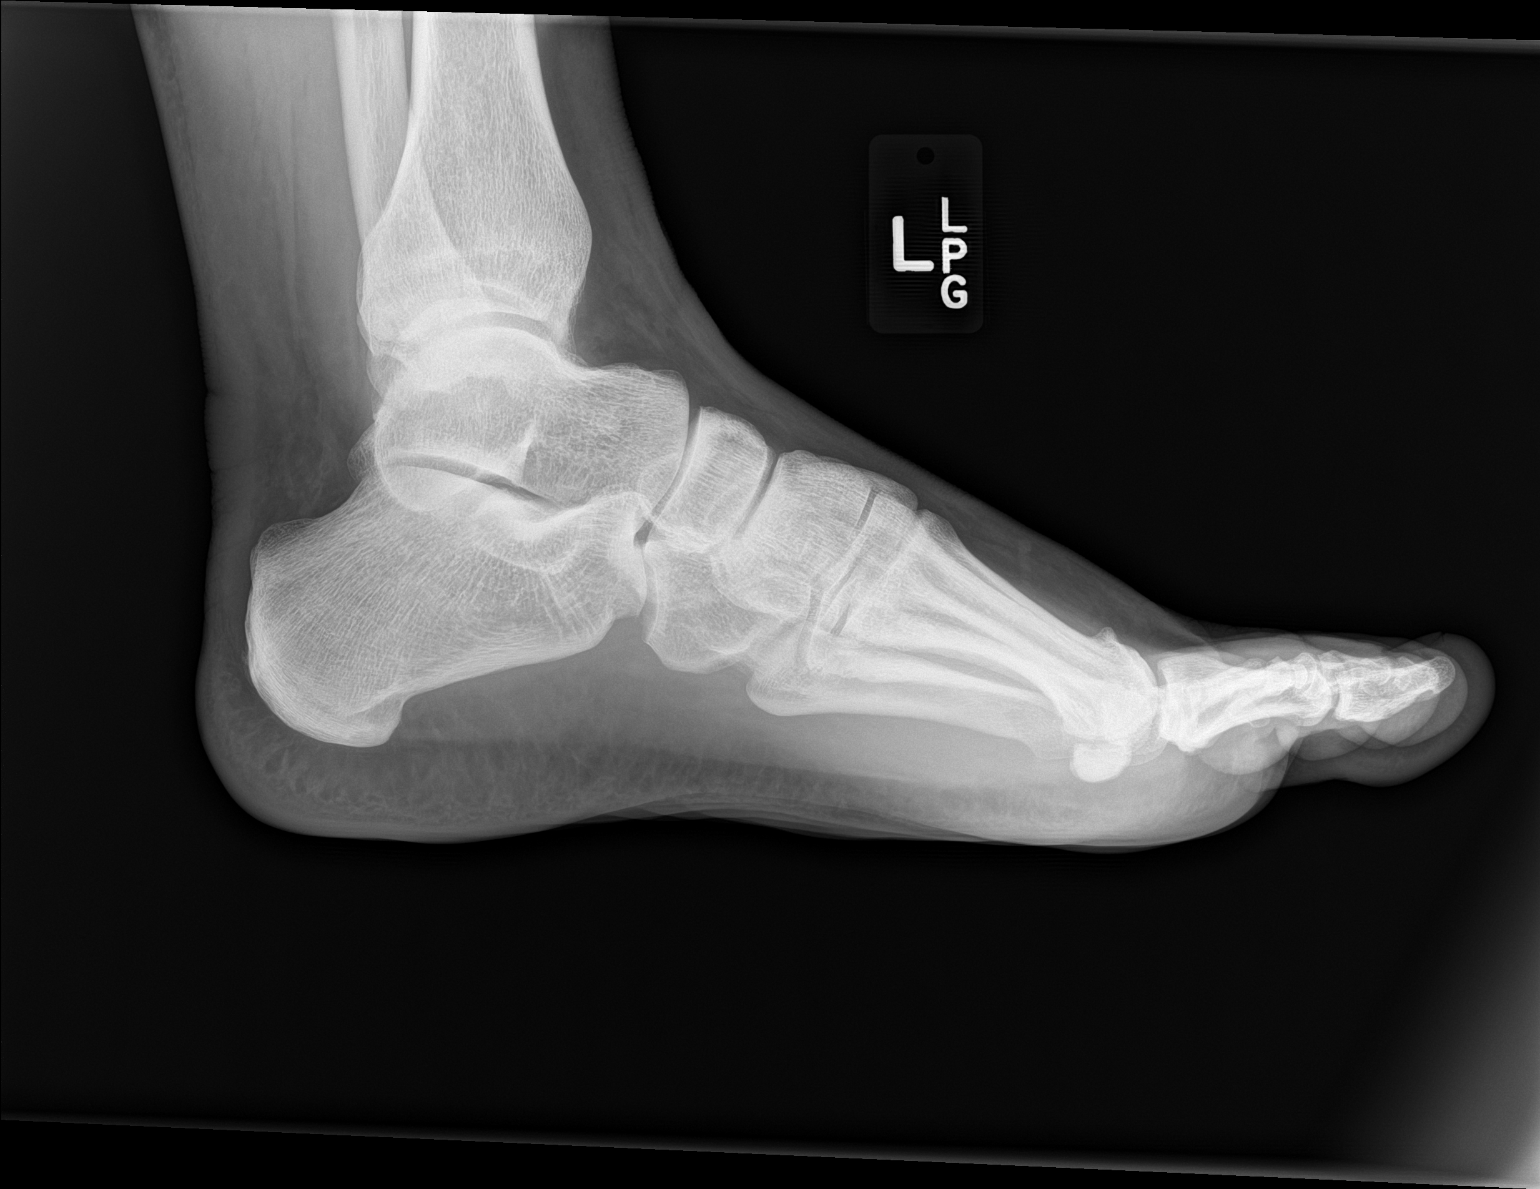

[3 of 3 positions shown; findings below may reference images not displayed]

FINDINGS: There is no evidence of fracture or dislocation. There is no
evidence of arthropathy or other focal bone abnormality. Soft
tissues are unremarkable.
IMPRESSION: Negative.

## 2021-09-22 ENCOUNTER — Other Ambulatory Visit (HOSPITAL_COMMUNITY): Payer: Self-pay

## 2021-09-30 ENCOUNTER — Other Ambulatory Visit (HOSPITAL_COMMUNITY): Payer: Self-pay

## 2021-10-06 ENCOUNTER — Other Ambulatory Visit (HOSPITAL_COMMUNITY): Payer: Self-pay

## 2021-10-06 MED ORDER — VITAMIN D (ERGOCALCIFEROL) 1.25 MG (50000 UNIT) PO CAPS
ORAL_CAPSULE | ORAL | 0 refills | Status: AC
  Filled 2021-10-06: qty 4, 28d supply, fill #0
  Filled 2021-11-22: qty 4, 28d supply, fill #1
  Filled 2022-04-17: qty 2, 14d supply, fill #2

## 2021-10-10 ENCOUNTER — Other Ambulatory Visit (HOSPITAL_COMMUNITY): Payer: Self-pay

## 2021-10-10 MED ORDER — OZEMPIC (0.25 OR 0.5 MG/DOSE) 2 MG/1.5ML ~~LOC~~ SOPN
PEN_INJECTOR | SUBCUTANEOUS | 11 refills | Status: DC
  Filled 2021-10-10: qty 1.5, 42d supply, fill #0

## 2021-10-10 MED ORDER — METFORMIN HCL 500 MG PO TABS
ORAL_TABLET | ORAL | 3 refills | Status: DC
  Filled 2021-10-10: qty 30, 30d supply, fill #0
  Filled 2021-11-12: qty 30, 30d supply, fill #1
  Filled 2021-12-15: qty 30, 30d supply, fill #2
  Filled 2022-01-11: qty 30, 30d supply, fill #3
  Filled 2022-02-06: qty 30, 30d supply, fill #4
  Filled 2022-03-22: qty 30, 30d supply, fill #5
  Filled 2022-04-17: qty 30, 30d supply, fill #6
  Filled 2022-06-02: qty 30, 30d supply, fill #7
  Filled 2022-07-17: qty 30, 30d supply, fill #8
  Filled 2022-08-14: qty 30, 30d supply, fill #9
  Filled 2022-09-10: qty 30, 30d supply, fill #10
  Filled 2022-10-08: qty 30, 30d supply, fill #11

## 2021-10-11 ENCOUNTER — Other Ambulatory Visit (HOSPITAL_COMMUNITY): Payer: Self-pay

## 2021-10-22 ENCOUNTER — Other Ambulatory Visit (HOSPITAL_COMMUNITY): Payer: Self-pay

## 2021-10-30 ENCOUNTER — Other Ambulatory Visit (HOSPITAL_COMMUNITY): Payer: Self-pay

## 2021-11-12 ENCOUNTER — Other Ambulatory Visit (HOSPITAL_COMMUNITY): Payer: Self-pay

## 2021-11-17 ENCOUNTER — Other Ambulatory Visit (HOSPITAL_COMMUNITY): Payer: Self-pay

## 2021-11-17 MED ORDER — OZEMPIC (0.25 OR 0.5 MG/DOSE) 2 MG/3ML ~~LOC~~ SOPN
PEN_INJECTOR | SUBCUTANEOUS | 11 refills | Status: AC
  Filled 2021-11-17: qty 3, 28d supply, fill #0
  Filled 2021-12-15: qty 3, 28d supply, fill #1
  Filled 2022-01-11: qty 3, 28d supply, fill #2
  Filled 2022-02-06: qty 3, 28d supply, fill #3
  Filled 2022-03-23: qty 3, 28d supply, fill #4
  Filled 2022-07-15: qty 3, 28d supply, fill #5
  Filled 2022-08-14: qty 3, 28d supply, fill #6
  Filled 2022-10-08: qty 3, 28d supply, fill #7

## 2021-11-21 ENCOUNTER — Other Ambulatory Visit (HOSPITAL_COMMUNITY): Payer: Self-pay

## 2021-11-22 ENCOUNTER — Other Ambulatory Visit (HOSPITAL_COMMUNITY): Payer: Self-pay

## 2021-12-01 ENCOUNTER — Other Ambulatory Visit (HOSPITAL_COMMUNITY): Payer: Self-pay

## 2021-12-15 ENCOUNTER — Other Ambulatory Visit (HOSPITAL_COMMUNITY): Payer: Self-pay

## 2021-12-25 ENCOUNTER — Other Ambulatory Visit (HOSPITAL_COMMUNITY): Payer: Self-pay

## 2022-01-06 ENCOUNTER — Other Ambulatory Visit (HOSPITAL_COMMUNITY): Payer: Self-pay

## 2022-01-08 ENCOUNTER — Other Ambulatory Visit (HOSPITAL_COMMUNITY): Payer: Self-pay

## 2022-01-12 ENCOUNTER — Other Ambulatory Visit (HOSPITAL_COMMUNITY): Payer: Self-pay

## 2022-01-16 ENCOUNTER — Other Ambulatory Visit (HOSPITAL_COMMUNITY): Payer: Self-pay

## 2022-01-27 ENCOUNTER — Other Ambulatory Visit (HOSPITAL_COMMUNITY): Payer: Self-pay

## 2022-02-06 ENCOUNTER — Other Ambulatory Visit (HOSPITAL_COMMUNITY): Payer: Self-pay

## 2022-02-07 ENCOUNTER — Other Ambulatory Visit (HOSPITAL_COMMUNITY): Payer: Self-pay

## 2022-02-09 ENCOUNTER — Other Ambulatory Visit (HOSPITAL_COMMUNITY): Payer: Self-pay

## 2022-02-09 MED ORDER — ROSUVASTATIN CALCIUM 5 MG PO TABS
5.0000 mg | ORAL_TABLET | Freq: Every day | ORAL | 3 refills | Status: DC
  Filled 2022-06-02: qty 30, 30d supply, fill #0
  Filled 2022-07-17: qty 30, 30d supply, fill #1
  Filled 2022-08-14: qty 30, 30d supply, fill #2
  Filled 2022-09-10: qty 30, 30d supply, fill #3
  Filled 2023-02-02 – 2023-02-04 (×2): qty 30, 30d supply, fill #4

## 2022-02-09 MED ORDER — ROSUVASTATIN CALCIUM 5 MG PO TABS
5.0000 mg | ORAL_TABLET | Freq: Every day | ORAL | 0 refills | Status: DC
  Filled 2022-02-09: qty 30, 30d supply, fill #0
  Filled 2022-03-16: qty 30, 30d supply, fill #1
  Filled 2022-04-17: qty 30, 30d supply, fill #2

## 2022-03-02 ENCOUNTER — Other Ambulatory Visit (HOSPITAL_COMMUNITY): Payer: Self-pay

## 2022-03-04 ENCOUNTER — Other Ambulatory Visit (HOSPITAL_COMMUNITY): Payer: Self-pay

## 2022-03-16 ENCOUNTER — Other Ambulatory Visit (HOSPITAL_COMMUNITY): Payer: Self-pay

## 2022-03-23 ENCOUNTER — Other Ambulatory Visit (HOSPITAL_COMMUNITY): Payer: Self-pay

## 2022-04-09 ENCOUNTER — Other Ambulatory Visit (HOSPITAL_COMMUNITY): Payer: Self-pay

## 2022-04-09 MED ORDER — DILTIAZEM HCL ER COATED BEADS 180 MG PO CP24
ORAL_CAPSULE | ORAL | 0 refills | Status: DC
  Filled 2022-04-09: qty 30, 30d supply, fill #0
  Filled 2022-04-17 – 2022-05-04 (×2): qty 30, 30d supply, fill #1
  Filled 2022-12-07: qty 30, 30d supply, fill #2

## 2022-04-14 ENCOUNTER — Other Ambulatory Visit (HOSPITAL_COMMUNITY): Payer: Self-pay

## 2022-04-14 MED ORDER — OZEMPIC (1 MG/DOSE) 4 MG/3ML ~~LOC~~ SOPN
PEN_INJECTOR | SUBCUTANEOUS | 3 refills | Status: AC
  Filled 2022-04-14: qty 3, 28d supply, fill #0
  Filled 2022-05-18: qty 3, 28d supply, fill #1
  Filled 2022-06-19: qty 3, 28d supply, fill #2
  Filled 2022-07-15: qty 3, 28d supply, fill #3

## 2022-04-17 ENCOUNTER — Other Ambulatory Visit (HOSPITAL_COMMUNITY): Payer: Self-pay

## 2022-04-29 ENCOUNTER — Other Ambulatory Visit (HOSPITAL_COMMUNITY): Payer: Self-pay

## 2022-05-04 ENCOUNTER — Other Ambulatory Visit (HOSPITAL_COMMUNITY): Payer: Self-pay

## 2022-05-04 MED ORDER — FLECAINIDE ACETATE 50 MG PO TABS
25.0000 mg | ORAL_TABLET | Freq: Two times a day (BID) | ORAL | 3 refills | Status: DC
  Filled 2022-05-04: qty 30, 30d supply, fill #0
  Filled 2022-06-02: qty 30, 30d supply, fill #1
  Filled 2022-07-17: qty 30, 30d supply, fill #2
  Filled 2022-08-14: qty 30, 30d supply, fill #3
  Filled 2022-09-10: qty 30, 30d supply, fill #4
  Filled 2022-10-08: qty 30, 30d supply, fill #5
  Filled 2022-11-05: qty 30, 30d supply, fill #6
  Filled 2022-11-25 – 2022-12-07 (×2): qty 30, 30d supply, fill #7
  Filled 2023-01-05: qty 30, 30d supply, fill #8
  Filled 2023-02-02 – 2023-02-04 (×2): qty 30, 30d supply, fill #9
  Filled 2023-02-24 – 2023-03-03 (×3): qty 30, 30d supply, fill #10
  Filled 2023-04-02: qty 30, 30d supply, fill #11

## 2022-05-05 ENCOUNTER — Other Ambulatory Visit (HOSPITAL_COMMUNITY): Payer: Self-pay

## 2022-05-05 MED ORDER — ESCITALOPRAM OXALATE 10 MG PO TABS
10.0000 mg | ORAL_TABLET | Freq: Every day | ORAL | 3 refills | Status: DC
  Filled 2022-05-05: qty 30, 30d supply, fill #0

## 2022-05-14 ENCOUNTER — Other Ambulatory Visit (HOSPITAL_COMMUNITY): Payer: Self-pay

## 2022-05-19 ENCOUNTER — Other Ambulatory Visit (HOSPITAL_COMMUNITY): Payer: Self-pay

## 2022-05-27 ENCOUNTER — Other Ambulatory Visit (HOSPITAL_COMMUNITY): Payer: Self-pay

## 2022-06-02 ENCOUNTER — Other Ambulatory Visit (HOSPITAL_COMMUNITY): Payer: Self-pay

## 2022-06-05 ENCOUNTER — Other Ambulatory Visit (HOSPITAL_COMMUNITY): Payer: Self-pay

## 2022-06-05 MED ORDER — DILTIAZEM HCL ER 180 MG PO CP24
180.0000 mg | ORAL_CAPSULE | Freq: Every day | ORAL | 3 refills | Status: AC
  Filled 2022-06-05: qty 30, 30d supply, fill #0
  Filled 2023-02-27: qty 30, 30d supply, fill #1

## 2022-06-08 ENCOUNTER — Other Ambulatory Visit (HOSPITAL_COMMUNITY): Payer: Self-pay

## 2022-06-09 ENCOUNTER — Other Ambulatory Visit (HOSPITAL_COMMUNITY): Payer: Self-pay

## 2022-06-09 MED ORDER — DILTIAZEM HCL ER COATED BEADS 180 MG PO CP24
180.0000 mg | ORAL_CAPSULE | Freq: Every day | ORAL | 3 refills | Status: DC
  Filled 2022-06-09: qty 30, 30d supply, fill #0
  Filled 2022-07-17: qty 30, 30d supply, fill #1
  Filled 2022-08-14: qty 30, 30d supply, fill #2
  Filled 2022-09-10 – 2022-10-08 (×2): qty 30, 30d supply, fill #3
  Filled 2022-12-29: qty 30, 30d supply, fill #4
  Filled 2023-02-02 – 2023-02-04 (×2): qty 30, 30d supply, fill #5
  Filled 2023-02-24 – 2023-04-02 (×2): qty 30, 30d supply, fill #6
  Filled 2023-05-04: qty 30, 30d supply, fill #7
  Filled 2023-05-31: qty 30, 30d supply, fill #8

## 2022-06-10 ENCOUNTER — Other Ambulatory Visit (HOSPITAL_COMMUNITY): Payer: Self-pay

## 2022-06-18 ENCOUNTER — Other Ambulatory Visit (HOSPITAL_COMMUNITY): Payer: Self-pay

## 2022-06-19 ENCOUNTER — Other Ambulatory Visit (HOSPITAL_COMMUNITY): Payer: Self-pay

## 2022-06-29 ENCOUNTER — Other Ambulatory Visit (HOSPITAL_COMMUNITY): Payer: Self-pay

## 2022-07-06 ENCOUNTER — Other Ambulatory Visit (HOSPITAL_COMMUNITY): Payer: Self-pay

## 2022-07-07 ENCOUNTER — Other Ambulatory Visit (HOSPITAL_COMMUNITY): Payer: Self-pay

## 2022-07-07 MED ORDER — TESTOSTERONE 50 MG/5GM (1%) TD GEL
TRANSDERMAL | 0 refills | Status: AC
  Filled 2022-07-07 – 2022-12-09 (×2): qty 150, 30d supply, fill #0

## 2022-07-15 ENCOUNTER — Other Ambulatory Visit (HOSPITAL_COMMUNITY): Payer: Self-pay

## 2022-07-17 ENCOUNTER — Other Ambulatory Visit (HOSPITAL_COMMUNITY): Payer: Self-pay

## 2022-07-20 ENCOUNTER — Other Ambulatory Visit (HOSPITAL_COMMUNITY): Payer: Self-pay

## 2022-07-20 MED ORDER — TESTOSTERONE 50 MG/5GM (1%) TD GEL
TRANSDERMAL | 0 refills | Status: AC
  Filled 2022-07-20 – 2022-08-13 (×2): qty 150, 30d supply, fill #0

## 2022-07-21 ENCOUNTER — Other Ambulatory Visit (HOSPITAL_COMMUNITY): Payer: Self-pay

## 2022-07-22 ENCOUNTER — Other Ambulatory Visit (HOSPITAL_COMMUNITY): Payer: Self-pay

## 2022-07-24 ENCOUNTER — Other Ambulatory Visit (HOSPITAL_COMMUNITY): Payer: Self-pay

## 2022-07-27 ENCOUNTER — Other Ambulatory Visit (HOSPITAL_COMMUNITY): Payer: Self-pay

## 2022-07-28 ENCOUNTER — Other Ambulatory Visit (HOSPITAL_COMMUNITY): Payer: Self-pay

## 2022-08-05 ENCOUNTER — Other Ambulatory Visit (HOSPITAL_COMMUNITY): Payer: Self-pay

## 2022-08-13 ENCOUNTER — Other Ambulatory Visit (HOSPITAL_COMMUNITY): Payer: Self-pay

## 2022-08-14 ENCOUNTER — Encounter (HOSPITAL_COMMUNITY): Payer: Self-pay | Admitting: Pharmacist

## 2022-08-14 ENCOUNTER — Other Ambulatory Visit (HOSPITAL_COMMUNITY): Payer: Self-pay

## 2022-08-14 MED ORDER — TESTOSTERONE 50 MG/5GM (1%) TD GEL
TRANSDERMAL | 3 refills | Status: AC
  Filled 2022-08-14 – 2022-10-08 (×3): qty 150, 30d supply, fill #0

## 2022-09-08 ENCOUNTER — Other Ambulatory Visit (HOSPITAL_COMMUNITY): Payer: Self-pay

## 2022-09-10 ENCOUNTER — Other Ambulatory Visit (HOSPITAL_COMMUNITY): Payer: Self-pay

## 2022-09-10 MED ORDER — TESTOSTERONE 1.62 % TD GEL
TRANSDERMAL | 5 refills | Status: AC
  Filled 2022-09-10: qty 75, 60d supply, fill #0
  Filled 2022-12-09: qty 75, 60d supply, fill #1
  Filled 2023-02-24: qty 75, 60d supply, fill #2

## 2022-09-11 ENCOUNTER — Other Ambulatory Visit (HOSPITAL_COMMUNITY): Payer: Self-pay

## 2022-09-11 ENCOUNTER — Other Ambulatory Visit: Payer: Self-pay

## 2022-09-11 MED ORDER — ALPRAZOLAM 0.25 MG PO TABS
0.2500 mg | ORAL_TABLET | Freq: Every day | ORAL | 0 refills | Status: AC | PRN
  Filled 2022-09-11: qty 15, 15d supply, fill #0

## 2022-09-11 MED ORDER — ROSUVASTATIN CALCIUM 5 MG PO TABS
5.0000 mg | ORAL_TABLET | Freq: Every day | ORAL | 0 refills | Status: AC
  Filled 2022-09-11: qty 90, 90d supply, fill #0
  Filled 2022-10-08: qty 30, 30d supply, fill #0
  Filled 2022-11-25: qty 30, 30d supply, fill #1
  Filled 2022-12-29: qty 30, 30d supply, fill #2

## 2022-09-14 ENCOUNTER — Other Ambulatory Visit (HOSPITAL_COMMUNITY): Payer: Self-pay

## 2022-09-29 ENCOUNTER — Other Ambulatory Visit (HOSPITAL_COMMUNITY): Payer: Self-pay

## 2022-10-08 ENCOUNTER — Other Ambulatory Visit (HOSPITAL_COMMUNITY): Payer: Self-pay

## 2022-10-08 ENCOUNTER — Other Ambulatory Visit: Payer: Self-pay

## 2022-10-09 ENCOUNTER — Other Ambulatory Visit (HOSPITAL_COMMUNITY): Payer: Self-pay

## 2022-10-22 ENCOUNTER — Other Ambulatory Visit (HOSPITAL_COMMUNITY): Payer: Self-pay

## 2022-11-05 ENCOUNTER — Other Ambulatory Visit: Payer: Self-pay

## 2022-11-05 ENCOUNTER — Other Ambulatory Visit (HOSPITAL_COMMUNITY): Payer: Self-pay

## 2022-11-25 ENCOUNTER — Other Ambulatory Visit (HOSPITAL_COMMUNITY): Payer: Self-pay

## 2022-11-25 MED ORDER — METFORMIN HCL 500 MG PO TABS
500.0000 mg | ORAL_TABLET | Freq: Every day | ORAL | 0 refills | Status: DC
  Filled 2022-11-25: qty 30, 30d supply, fill #0
  Filled 2022-12-29: qty 30, 30d supply, fill #1
  Filled 2023-01-27: qty 30, 30d supply, fill #2

## 2022-12-07 ENCOUNTER — Other Ambulatory Visit (HOSPITAL_COMMUNITY): Payer: Self-pay

## 2022-12-09 ENCOUNTER — Other Ambulatory Visit (HOSPITAL_COMMUNITY): Payer: Self-pay

## 2022-12-29 ENCOUNTER — Other Ambulatory Visit (HOSPITAL_COMMUNITY): Payer: Self-pay

## 2023-01-01 ENCOUNTER — Other Ambulatory Visit (HOSPITAL_COMMUNITY): Payer: Self-pay

## 2023-01-01 MED ORDER — MOUNJARO 2.5 MG/0.5ML ~~LOC~~ SOAJ
2.5000 mg | SUBCUTANEOUS | 0 refills | Status: AC
  Filled 2023-01-01 – 2023-11-17 (×3): qty 2, 28d supply, fill #0

## 2023-01-05 ENCOUNTER — Other Ambulatory Visit (HOSPITAL_COMMUNITY): Payer: Self-pay

## 2023-01-07 ENCOUNTER — Other Ambulatory Visit (HOSPITAL_COMMUNITY): Payer: Self-pay

## 2023-01-14 ENCOUNTER — Other Ambulatory Visit (HOSPITAL_COMMUNITY): Payer: Self-pay

## 2023-01-15 ENCOUNTER — Other Ambulatory Visit (HOSPITAL_COMMUNITY): Payer: Self-pay

## 2023-01-16 ENCOUNTER — Other Ambulatory Visit (HOSPITAL_COMMUNITY): Payer: Self-pay

## 2023-01-22 ENCOUNTER — Other Ambulatory Visit (HOSPITAL_COMMUNITY): Payer: Self-pay

## 2023-01-23 ENCOUNTER — Other Ambulatory Visit (HOSPITAL_COMMUNITY): Payer: Self-pay

## 2023-01-26 ENCOUNTER — Other Ambulatory Visit (HOSPITAL_COMMUNITY): Payer: Self-pay

## 2023-01-27 ENCOUNTER — Other Ambulatory Visit (HOSPITAL_COMMUNITY): Payer: Self-pay

## 2023-01-28 ENCOUNTER — Other Ambulatory Visit (HOSPITAL_COMMUNITY): Payer: Self-pay

## 2023-01-29 ENCOUNTER — Other Ambulatory Visit (HOSPITAL_COMMUNITY): Payer: Self-pay

## 2023-02-04 ENCOUNTER — Other Ambulatory Visit (HOSPITAL_COMMUNITY): Payer: Self-pay

## 2023-02-24 ENCOUNTER — Other Ambulatory Visit (HOSPITAL_COMMUNITY): Payer: Self-pay

## 2023-02-25 ENCOUNTER — Other Ambulatory Visit (HOSPITAL_COMMUNITY): Payer: Self-pay

## 2023-02-26 ENCOUNTER — Other Ambulatory Visit (HOSPITAL_COMMUNITY): Payer: Self-pay

## 2023-02-27 ENCOUNTER — Other Ambulatory Visit (HOSPITAL_COMMUNITY): Payer: Self-pay

## 2023-03-01 ENCOUNTER — Other Ambulatory Visit (HOSPITAL_COMMUNITY): Payer: Self-pay

## 2023-03-01 MED ORDER — DILTIAZEM HCL ER COATED BEADS 180 MG PO CP24
ORAL_CAPSULE | ORAL | 1 refills | Status: DC
  Filled 2023-03-01: qty 90, 90d supply, fill #0
  Filled 2023-04-02 – 2023-05-28 (×2): qty 90, 90d supply, fill #1

## 2023-03-03 ENCOUNTER — Other Ambulatory Visit (HOSPITAL_COMMUNITY): Payer: Self-pay

## 2023-03-04 ENCOUNTER — Other Ambulatory Visit (HOSPITAL_COMMUNITY): Payer: Self-pay

## 2023-03-06 ENCOUNTER — Other Ambulatory Visit (HOSPITAL_COMMUNITY): Payer: Self-pay

## 2023-03-08 ENCOUNTER — Other Ambulatory Visit (HOSPITAL_COMMUNITY): Payer: Self-pay

## 2023-03-08 MED ORDER — ROSUVASTATIN CALCIUM 5 MG PO TABS
5.0000 mg | ORAL_TABLET | Freq: Every day | ORAL | 3 refills | Status: AC
Start: 2023-03-08 — End: ?
  Filled 2023-03-08: qty 90, 90d supply, fill #0
  Filled 2023-04-02 – 2023-05-04 (×2): qty 90, 90d supply, fill #1
  Filled 2023-06-14 – 2023-07-30 (×2): qty 90, 90d supply, fill #2
  Filled 2023-10-18: qty 90, 90d supply, fill #3

## 2023-03-08 MED ORDER — METFORMIN HCL 500 MG PO TABS
500.0000 mg | ORAL_TABLET | Freq: Every day | ORAL | 0 refills | Status: DC
Start: 2023-03-08 — End: 2023-06-14
  Filled 2023-03-08: qty 90, 90d supply, fill #0

## 2023-03-09 ENCOUNTER — Other Ambulatory Visit (HOSPITAL_COMMUNITY): Payer: Self-pay

## 2023-04-02 ENCOUNTER — Encounter (HOSPITAL_COMMUNITY): Payer: Self-pay | Admitting: Pharmacist

## 2023-04-02 ENCOUNTER — Other Ambulatory Visit (HOSPITAL_COMMUNITY): Payer: Self-pay

## 2023-04-20 ENCOUNTER — Other Ambulatory Visit (HOSPITAL_COMMUNITY): Payer: Self-pay

## 2023-04-20 MED ORDER — PREDNISONE 20 MG PO TABS
40.0000 mg | ORAL_TABLET | Freq: Every day | ORAL | 0 refills | Status: AC
  Filled 2023-04-20: qty 10, 5d supply, fill #0

## 2023-04-20 MED ORDER — AMOXICILLIN 500 MG PO CAPS
1000.0000 mg | ORAL_CAPSULE | Freq: Three times a day (TID) | ORAL | 0 refills | Status: AC
Start: 2023-04-20 — End: ?
  Filled 2023-04-20: qty 42, 7d supply, fill #0

## 2023-04-20 MED ORDER — AZELASTINE HCL 0.1 % NA SOLN
2.0000 | Freq: Two times a day (BID) | NASAL | 0 refills | Status: AC
  Filled 2023-04-20: qty 30, 50d supply, fill #0

## 2023-05-04 ENCOUNTER — Other Ambulatory Visit (HOSPITAL_BASED_OUTPATIENT_CLINIC_OR_DEPARTMENT_OTHER): Payer: Self-pay

## 2023-05-04 ENCOUNTER — Other Ambulatory Visit (HOSPITAL_COMMUNITY): Payer: Self-pay

## 2023-05-04 MED ORDER — FLECAINIDE ACETATE 50 MG PO TABS
25.0000 mg | ORAL_TABLET | Freq: Two times a day (BID) | ORAL | 0 refills | Status: DC
Start: 2023-05-04 — End: 2023-06-14
  Filled 2023-05-04: qty 90, 90d supply, fill #0

## 2023-05-06 ENCOUNTER — Other Ambulatory Visit (HOSPITAL_COMMUNITY): Payer: Self-pay

## 2023-05-28 ENCOUNTER — Other Ambulatory Visit (HOSPITAL_COMMUNITY): Payer: Self-pay

## 2023-05-31 ENCOUNTER — Other Ambulatory Visit (HOSPITAL_COMMUNITY): Payer: Self-pay

## 2023-05-31 ENCOUNTER — Other Ambulatory Visit: Payer: Self-pay

## 2023-06-02 ENCOUNTER — Other Ambulatory Visit (HOSPITAL_COMMUNITY): Payer: Self-pay

## 2023-06-04 ENCOUNTER — Other Ambulatory Visit (HOSPITAL_COMMUNITY): Payer: Self-pay

## 2023-06-14 ENCOUNTER — Other Ambulatory Visit (HOSPITAL_COMMUNITY): Payer: Self-pay

## 2023-06-14 MED ORDER — FLECAINIDE ACETATE 50 MG PO TABS
25.0000 mg | ORAL_TABLET | Freq: Two times a day (BID) | ORAL | 0 refills | Status: DC
  Filled 2023-06-14 – 2023-07-30 (×2): qty 90, 90d supply, fill #0

## 2023-06-14 MED ORDER — METFORMIN HCL 500 MG PO TABS
500.0000 mg | ORAL_TABLET | Freq: Every day | ORAL | 0 refills | Status: AC
  Filled 2023-06-14: qty 90, 90d supply, fill #0

## 2023-06-25 ENCOUNTER — Other Ambulatory Visit (HOSPITAL_COMMUNITY): Payer: Self-pay

## 2023-07-30 ENCOUNTER — Other Ambulatory Visit: Payer: Self-pay

## 2023-07-30 ENCOUNTER — Other Ambulatory Visit (HOSPITAL_COMMUNITY): Payer: Self-pay

## 2023-07-30 MED ORDER — DILTIAZEM HCL ER COATED BEADS 180 MG PO CP24
180.0000 mg | ORAL_CAPSULE | Freq: Every day | ORAL | 1 refills | Status: AC
  Filled 2023-07-30: qty 30, 30d supply, fill #0
  Filled 2023-08-25: qty 30, 30d supply, fill #1
  Filled 2023-10-18: qty 30, 30d supply, fill #2
  Filled 2023-12-14: qty 30, 30d supply, fill #3
  Filled 2024-01-24 – 2024-03-07 (×2): qty 30, 30d supply, fill #4

## 2023-08-25 ENCOUNTER — Other Ambulatory Visit (HOSPITAL_COMMUNITY): Payer: Self-pay

## 2023-08-25 ENCOUNTER — Other Ambulatory Visit: Payer: Self-pay

## 2023-08-25 MED ORDER — FLECAINIDE ACETATE 50 MG PO TABS
25.0000 mg | ORAL_TABLET | Freq: Two times a day (BID) | ORAL | 0 refills | Status: DC
  Filled 2023-08-25 – 2023-10-18 (×2): qty 90, 90d supply, fill #0

## 2023-08-26 ENCOUNTER — Other Ambulatory Visit (HOSPITAL_COMMUNITY): Payer: Self-pay

## 2023-09-11 ENCOUNTER — Other Ambulatory Visit (HOSPITAL_COMMUNITY): Payer: Self-pay

## 2023-10-18 ENCOUNTER — Other Ambulatory Visit (HOSPITAL_COMMUNITY): Payer: Self-pay

## 2023-10-18 ENCOUNTER — Other Ambulatory Visit: Payer: Self-pay

## 2023-10-18 MED ORDER — DILTIAZEM HCL ER COATED BEADS 180 MG PO CP24
180.0000 mg | ORAL_CAPSULE | Freq: Every day | ORAL | 0 refills | Status: DC
  Filled 2023-10-18 – 2023-11-16 (×2): qty 30, 30d supply, fill #0

## 2023-10-19 ENCOUNTER — Other Ambulatory Visit (HOSPITAL_COMMUNITY): Payer: Self-pay

## 2023-10-20 ENCOUNTER — Other Ambulatory Visit (HOSPITAL_COMMUNITY): Payer: Self-pay

## 2023-11-16 ENCOUNTER — Other Ambulatory Visit (HOSPITAL_COMMUNITY): Payer: Self-pay

## 2023-11-16 ENCOUNTER — Other Ambulatory Visit: Payer: Self-pay

## 2023-11-17 ENCOUNTER — Other Ambulatory Visit (HOSPITAL_COMMUNITY): Payer: Self-pay

## 2023-11-17 ENCOUNTER — Other Ambulatory Visit: Payer: Self-pay

## 2023-11-22 ENCOUNTER — Other Ambulatory Visit (HOSPITAL_COMMUNITY): Payer: Self-pay

## 2023-11-27 ENCOUNTER — Other Ambulatory Visit (HOSPITAL_COMMUNITY): Payer: Self-pay

## 2023-12-02 ENCOUNTER — Other Ambulatory Visit (HOSPITAL_COMMUNITY): Payer: Self-pay

## 2023-12-14 ENCOUNTER — Other Ambulatory Visit: Payer: Self-pay

## 2023-12-14 ENCOUNTER — Other Ambulatory Visit (HOSPITAL_COMMUNITY): Payer: Self-pay

## 2023-12-20 ENCOUNTER — Other Ambulatory Visit (HOSPITAL_COMMUNITY): Payer: Self-pay

## 2023-12-31 ENCOUNTER — Other Ambulatory Visit: Payer: Self-pay

## 2023-12-31 ENCOUNTER — Other Ambulatory Visit (HOSPITAL_COMMUNITY): Payer: Self-pay

## 2023-12-31 MED ORDER — DILTIAZEM HCL ER COATED BEADS 120 MG PO CP24
120.0000 mg | ORAL_CAPSULE | Freq: Every day | ORAL | 3 refills | Status: AC
  Filled 2023-12-31: qty 90, 90d supply, fill #0
  Filled 2024-01-24 – 2024-04-18 (×2): qty 90, 90d supply, fill #1
  Filled 2024-07-11 – 2024-08-08 (×2): qty 90, 90d supply, fill #2

## 2023-12-31 MED ORDER — FLECAINIDE ACETATE 50 MG PO TABS
50.0000 mg | ORAL_TABLET | Freq: Two times a day (BID) | ORAL | 3 refills | Status: AC
  Filled 2023-12-31: qty 180, 90d supply, fill #0
  Filled 2024-01-24 – 2024-05-08 (×2): qty 180, 90d supply, fill #1
  Filled 2024-08-08: qty 180, 90d supply, fill #2

## 2024-01-11 ENCOUNTER — Other Ambulatory Visit (HOSPITAL_BASED_OUTPATIENT_CLINIC_OR_DEPARTMENT_OTHER): Payer: Self-pay

## 2024-01-11 ENCOUNTER — Other Ambulatory Visit (HOSPITAL_COMMUNITY): Payer: Self-pay

## 2024-01-11 MED ORDER — AZITHROMYCIN 250 MG PO TABS
ORAL_TABLET | ORAL | 0 refills | Status: AC
  Filled 2024-01-11: qty 6, 5d supply, fill #0

## 2024-01-11 MED ORDER — AMOXICILLIN-POT CLAVULANATE 875-125 MG PO TABS
1.0000 | ORAL_TABLET | Freq: Two times a day (BID) | ORAL | 0 refills | Status: AC
  Filled 2024-01-11: qty 14, 7d supply, fill #0

## 2024-01-24 ENCOUNTER — Other Ambulatory Visit: Payer: Self-pay

## 2024-01-24 ENCOUNTER — Other Ambulatory Visit (HOSPITAL_COMMUNITY): Payer: Self-pay

## 2024-01-27 ENCOUNTER — Other Ambulatory Visit (HOSPITAL_COMMUNITY): Payer: Self-pay

## 2024-03-07 ENCOUNTER — Other Ambulatory Visit (HOSPITAL_COMMUNITY): Payer: Self-pay

## 2024-03-07 ENCOUNTER — Other Ambulatory Visit: Payer: Self-pay

## 2024-03-23 ENCOUNTER — Encounter (HOSPITAL_COMMUNITY): Payer: Self-pay

## 2024-03-23 ENCOUNTER — Other Ambulatory Visit (HOSPITAL_COMMUNITY): Payer: Self-pay

## 2024-04-18 ENCOUNTER — Other Ambulatory Visit: Payer: Self-pay

## 2024-04-18 ENCOUNTER — Other Ambulatory Visit (HOSPITAL_COMMUNITY): Payer: Self-pay

## 2024-05-08 ENCOUNTER — Other Ambulatory Visit (HOSPITAL_COMMUNITY): Payer: Self-pay

## 2024-07-11 ENCOUNTER — Other Ambulatory Visit (HOSPITAL_COMMUNITY): Payer: Self-pay

## 2024-08-07 ENCOUNTER — Other Ambulatory Visit (HOSPITAL_COMMUNITY): Payer: Self-pay

## 2024-08-09 ENCOUNTER — Other Ambulatory Visit (HOSPITAL_COMMUNITY): Payer: Self-pay
